# Patient Record
Sex: Male | Born: 1955 | Race: White | Hispanic: No | Smoking: Current every day smoker
Health system: Southern US, Community
[De-identification: ages and names within clinical notes are randomized; demographics above are authoritative.]

## PROBLEM LIST (undated history)

## (undated) DIAGNOSIS — D66 Hereditary factor VIII deficiency: Secondary | ICD-10-CM

## (undated) DIAGNOSIS — B1920 Unspecified viral hepatitis C without hepatic coma: Secondary | ICD-10-CM

## (undated) DIAGNOSIS — J449 Chronic obstructive pulmonary disease, unspecified: Secondary | ICD-10-CM

## (undated) DIAGNOSIS — C801 Malignant (primary) neoplasm, unspecified: Secondary | ICD-10-CM

## (undated) DIAGNOSIS — I10 Essential (primary) hypertension: Secondary | ICD-10-CM

## (undated) DIAGNOSIS — I456 Pre-excitation syndrome: Secondary | ICD-10-CM

## (undated) DIAGNOSIS — E7800 Pure hypercholesterolemia, unspecified: Secondary | ICD-10-CM

## (undated) HISTORY — PX: TONSILLECTOMY: SUR1361

## (undated) HISTORY — PX: SUBDURAL HEMATOMA EVACUATION VIA CRANIOTOMY: SUR319

---

## 2004-11-17 ENCOUNTER — Emergency Department: Payer: Self-pay | Admitting: Emergency Medicine

## 2004-12-01 ENCOUNTER — Emergency Department: Payer: Self-pay | Admitting: Emergency Medicine

## 2007-03-18 ENCOUNTER — Inpatient Hospital Stay: Payer: Self-pay | Admitting: Internal Medicine

## 2007-03-18 ENCOUNTER — Other Ambulatory Visit: Payer: Self-pay

## 2009-05-08 ENCOUNTER — Observation Stay: Payer: Self-pay | Admitting: Internal Medicine

## 2012-01-30 ENCOUNTER — Emergency Department: Payer: Self-pay | Admitting: Emergency Medicine

## 2012-06-19 ENCOUNTER — Emergency Department: Payer: Self-pay | Admitting: Emergency Medicine

## 2014-03-28 ENCOUNTER — Emergency Department: Payer: Self-pay | Admitting: Physician Assistant

## 2014-04-08 ENCOUNTER — Emergency Department
Admit: 2014-04-08 | Disposition: A | Discharge status: Home / Self Care | Payer: Self-pay | Admitting: Emergency Medicine

## 2014-06-13 ENCOUNTER — Emergency Department: Payer: Medicare Other

## 2014-06-13 ENCOUNTER — Emergency Department
Admission: EM | Admit: 2014-06-13 | Discharge: 2014-06-14 | Disposition: A | Discharge status: Home / Self Care | Payer: Medicare Other | Attending: Emergency Medicine | Admitting: Emergency Medicine

## 2014-06-13 ENCOUNTER — Other Ambulatory Visit: Payer: Self-pay

## 2014-06-13 ENCOUNTER — Encounter: Payer: Self-pay | Admitting: *Deleted

## 2014-06-13 DIAGNOSIS — Z720 Tobacco use: Secondary | ICD-10-CM | POA: Diagnosis not present

## 2014-06-13 DIAGNOSIS — J441 Chronic obstructive pulmonary disease with (acute) exacerbation: Secondary | ICD-10-CM | POA: Insufficient documentation

## 2014-06-13 DIAGNOSIS — Z880 Allergy status to penicillin: Secondary | ICD-10-CM | POA: Diagnosis not present

## 2014-06-13 DIAGNOSIS — J189 Pneumonia, unspecified organism: Secondary | ICD-10-CM | POA: Diagnosis not present

## 2014-06-13 DIAGNOSIS — R0602 Shortness of breath: Secondary | ICD-10-CM | POA: Diagnosis present

## 2014-06-13 HISTORY — DX: Unspecified viral hepatitis C without hepatic coma: B19.20

## 2014-06-13 HISTORY — DX: Pre-excitation syndrome: I45.6

## 2014-06-13 HISTORY — DX: Chronic obstructive pulmonary disease, unspecified: J44.9

## 2014-06-13 LAB — CBC
HEMATOCRIT: 43.2 % (ref 40.0–52.0)
HEMOGLOBIN: 15 g/dL (ref 13.0–18.0)
MCH: 30.4 pg (ref 26.0–34.0)
MCHC: 34.6 g/dL (ref 32.0–36.0)
MCV: 87.8 fL (ref 80.0–100.0)
Platelets: 227 10*3/uL (ref 150–440)
RBC: 4.92 MIL/uL (ref 4.40–5.90)
RDW: 13 % (ref 11.5–14.5)
WBC: 16.2 10*3/uL — AB (ref 3.8–10.6)

## 2014-06-13 LAB — BASIC METABOLIC PANEL
Anion gap: 14 (ref 5–15)
BUN: 13 mg/dL (ref 6–20)
CALCIUM: 9 mg/dL (ref 8.9–10.3)
CO2: 32 mmol/L (ref 22–32)
Chloride: 77 mmol/L — ABNORMAL LOW (ref 101–111)
Creatinine, Ser: 1.01 mg/dL (ref 0.61–1.24)
GFR calc non Af Amer: >60 mL/min (ref >60)
Glucose, Bld: 151 mg/dL — ABNORMAL HIGH (ref 65–99)
Potassium: 3.2 mmol/L — ABNORMAL LOW (ref 3.5–5.1)
Sodium: 123 mmol/L — ABNORMAL LOW (ref 135–145)

## 2014-06-13 LAB — TROPONIN I: Troponin I: <0.03 ng/mL (ref <0.031)

## 2014-06-13 MED ORDER — PREDNISONE 20 MG PO TABS
60.0000 mg | ORAL_TABLET | Freq: Once | ORAL | Status: AC
Start: 2014-06-13 — End: 2014-06-13
  Administered 2014-06-13: 60 mg via ORAL

## 2014-06-13 MED ORDER — LEVOFLOXACIN 500 MG PO TABS
ORAL_TABLET | ORAL | Status: AC
  Filled 2014-06-13: qty 1

## 2014-06-13 MED ORDER — PREDNISONE 20 MG PO TABS: 40.0000 mg | ORAL_TABLET | Freq: Every day | ORAL | Status: DC

## 2014-06-13 MED ORDER — LEVOFLOXACIN 250 MG PO TABS
ORAL_TABLET | ORAL | Status: AC
  Administered 2014-06-13: 750 mg via ORAL
  Filled 2014-06-13: qty 1

## 2014-06-13 MED ORDER — LEVOFLOXACIN 750 MG PO TABS
750.0000 mg | ORAL_TABLET | Freq: Once | ORAL | Status: AC
  Administered 2014-06-13: 750 mg via ORAL

## 2014-06-13 MED ORDER — PREDNISONE 20 MG PO TABS
ORAL_TABLET | ORAL | Status: AC
  Administered 2014-06-13: 60 mg via ORAL
  Filled 2014-06-13: qty 3

## 2014-06-13 MED ORDER — SODIUM CHLORIDE 0.9 % IV BOLUS (SEPSIS): 500.0000 mL | Freq: Once | INTRAVENOUS | Status: DC

## 2014-06-13 MED ORDER — LEVOFLOXACIN 750 MG PO TABS: 750.0000 mg | ORAL_TABLET | Freq: Every day | ORAL | Status: DC

## 2014-06-13 MED ORDER — IPRATROPIUM-ALBUTEROL 0.5-2.5 (3) MG/3ML IN SOLN
3.0000 mL | Freq: Once | RESPIRATORY_TRACT | Status: AC
  Administered 2014-06-13: 3 mL via RESPIRATORY_TRACT

## 2014-06-13 MED ORDER — IPRATROPIUM-ALBUTEROL 0.5-2.5 (3) MG/3ML IN SOLN
RESPIRATORY_TRACT | Status: AC
  Administered 2014-06-13: 3 mL via RESPIRATORY_TRACT
  Filled 2014-06-13: qty 3

## 2014-06-13 NOTE — ED Notes (Signed)
Pt presents w/ c/o intermittent chills and fevers, increasing shortness of breath, today starting w/ gagging, denies vomiting. Pt has audible wheezing, labored breathing, and accessory muscle use. Pt endorses hx of COPD. States he has been using his meds and MDI's as appropriate w/o relief.

## 2014-06-13 NOTE — ED Provider Notes (Signed)
[facility] Emergency Department Provider Note  ____________________________________________  Time seen: 11:10 PM  I have reviewed the triage vital signs and the nursing notes.   HISTORY  Chief Complaint Shortness of Breath    HPI Paul Haynes is a 59 y.o. male who has chronic COPD and is reported long-standing shortness of breath who was today with complaints of a gagging or choking sensation. He feels that his wheezing is actually better than usual and that his breathing is otherwise in good shape right now. He does however note that he has been having intermittent low-grade fevers to 100.8 maximum over the last 2 days with chills intermittently. Denies any vomiting diarrhea chest pain or dizziness. No loss of balance or coordination or falls or passing out.He has had increased coughing but is nonproductive     Past Medical History  Diagnosis Date  . COPD (chronic obstructive pulmonary disease)   . WPW (Wolff-Parkinson-White syndrome)   . Hepatitis C     There are no active problems to display for this patient.   Past Surgical History  Procedure Laterality Date  . Tonsillectomy    . Subdural hematoma evacuation via craniotomy      Current Outpatient Rx  Name  Route  Sig  Dispense  Refill  . levofloxacin (LEVAQUIN) 750 MG tablet   Oral   Take 1 tablet (750 mg total) by mouth daily.   7 tablet   0   . predniSONE (DELTASONE) 20 MG tablet   Oral   Take 2 tablets (40 mg total) by mouth daily.   8 tablet   0     Allergies Penicillins  History reviewed. No pertinent family history.  Social History History  Substance Use Topics  . Smoking status: Current Every Day Smoker  . Smokeless tobacco: Never Used  . Alcohol Use: No    Review of Systems  Constitutional: Low-grade fever and chills. No weight changes Eyes:No blurry vision or double vision.  ENT: No sore throat. Cardiovascular: No chest pain. Respiratory: Chronic  shortness of breath, increased nonproductive cough Gastrointestinal: Negative for abdominal pain, vomiting and diarrhea.  No BRBPR or melena. Genitourinary: Negative for dysuria, urinary retention, bloody urine, or difficulty urinating. Musculoskeletal: Negative for back pain. No joint swelling or pain. Skin: Negative for rash. Neurological: Negative for headaches, focal weakness or numbness. Psychiatric:No anxiety or depression.   Endocrine:No hot/cold intolerance, changes in energy, or sleep difficulty.  10-point ROS otherwise negative.  ____________________________________________   PHYSICAL EXAM:  VITAL SIGNS: ED Triage Vitals  Enc Vitals Group     BP 06/13/14 2009 147/71 mmHg     Pulse Rate 06/13/14 2009 66     Resp 06/13/14 2009 24     Temp 06/13/14 2009 98.4 F (36.9 C)     Temp Source 06/13/14 2009 Oral     SpO2 06/13/14 2009 95 %     Weight 06/13/14 2009 190 lb (86.183 kg)     Height 06/13/14 2009 5\' 7"  (1.702 m)     Head Cir --      Peak Flow --      Pain Score --      Pain Loc --      Pain Edu? --      Excl. in [place]? --      Constitutional: Alert and oriented. Well appearing and in no distress. Eyes: No scleral icterus. No conjunctival pallor. PERRL. EOMI ENT   Head: Normocephalic and atraumatic.   Nose: No congestion/rhinnorhea.  No septal hematoma   Mouth/Throat: MMM, no pharyngeal erythema. No peritonsillar mass. No uvula shift.   Neck: No stridor. No SubQ emphysema. No meningismus. Hematological/Lymphatic/Immunilogical: No cervical lymphadenopathy. Cardiovascular: RRR. Normal and symmetric distal pulses are present in all extremities. No murmurs, rubs, or gallops. Respiratory: Normal respiratory effort without tachypnea nor retractions. Diffuse wheezing in all lung fields with normal expiratory phase. Gastrointestinal: Soft and nontender. No distention. There is no CVA tenderness.  No rebound, rigidity, or guarding. Genitourinary:  deferred Musculoskeletal: Nontender with normal range of motion in all extremities. No joint effusions.  No lower extremity tenderness.  No edema. Neurologic:   Normal speech and language.  CN 2-10 normal. Motor grossly intact. No pronator drift.  Normal gait. No gross focal neurologic deficits are appreciated.  Skin:  Skin is warm, dry and intact. No rash noted.  No petechiae, purpura, or bullae. Psychiatric: Mood and affect are normal. Speech and behavior are normal. Patient exhibits appropriate insight and judgment.  ____________________________________________    LABS (pertinent positives/negatives) (all labs ordered are listed, but only abnormal results are displayed) Labs Reviewed  BASIC METABOLIC PANEL - Abnormal; Notable for the following:    Sodium 123 (*)    Potassium 3.2 (*)    Chloride 77 (*)    Glucose, Bld 151 (*)    All other components within normal limits  CBC - Abnormal; Notable for the following:    WBC 16.2 (*)    All other components within normal limits  TROPONIN I   ____________________________________________   EKG  Interpreted by me  Date: 06/13/2014  Rate: 69  Rhythm: normal sinus rhythm  QRS Axis: normal  Intervals: normal  ST/T Wave abnormalities: normal  Conduction Disutrbances: none  Narrative Interpretation: unremarkable      ____________________________________________    RADIOLOGY  Chest x-ray unremarkable  ____________________________________________   PROCEDURES  ____________________________________________   INITIAL IMPRESSION / ASSESSMENT AND PLAN / ED COURSE  Pertinent labs & imaging results that were available during my care of the patient were reviewed by me and considered in my medical decision making (see chart for details).  Patient is well-appearing no acute distress. Watching TV breathing comfortably. He is eating and drinking without difficulty. His symptoms and the hyponatremia on labs as well as the  diffuse wheezing and absence of focal findings on auscultation or chest x-ray are consistent with an atypical pneumonia, possibly Legionella. The patient denies any recent trauma or institutional stays. We'll start him on doxycycline and a short course of steroids to treat any acquired atypical pneumonia have him follow-up with his primary care doctor in a week. Vision of ACS CHF PE or pneumothorax 3 times a day or sepsis.  ____________________________________________   FINAL CLINICAL IMPRESSION(S) / ED DIAGNOSES  Final diagnoses:  Atypical pneumonia      Carrie Mew, MD 06/13/14 2351

## 2014-06-13 NOTE — Discharge Instructions (Signed)

## 2014-06-14 NOTE — ED Notes (Signed)
Pt alert and in NAD at time of d\c to family.

## 2014-06-23 ENCOUNTER — Emergency Department: Payer: Medicare Other

## 2014-06-23 ENCOUNTER — Encounter: Payer: Self-pay | Admitting: Emergency Medicine

## 2014-06-23 ENCOUNTER — Other Ambulatory Visit: Payer: Self-pay

## 2014-06-23 ENCOUNTER — Emergency Department
Admission: EM | Admit: 2014-06-23 | Discharge: 2014-06-23 | Disposition: A | Discharge status: Home / Self Care | Payer: Medicare Other | Attending: Emergency Medicine | Admitting: Emergency Medicine

## 2014-06-23 DIAGNOSIS — Z720 Tobacco use: Secondary | ICD-10-CM | POA: Diagnosis not present

## 2014-06-23 DIAGNOSIS — S29011A Strain of muscle and tendon of front wall of thorax, initial encounter: Secondary | ICD-10-CM

## 2014-06-23 DIAGNOSIS — Y9289 Other specified places as the place of occurrence of the external cause: Secondary | ICD-10-CM | POA: Insufficient documentation

## 2014-06-23 DIAGNOSIS — Y9389 Activity, other specified: Secondary | ICD-10-CM | POA: Insufficient documentation

## 2014-06-23 DIAGNOSIS — Z792 Long term (current) use of antibiotics: Secondary | ICD-10-CM | POA: Insufficient documentation

## 2014-06-23 DIAGNOSIS — Y998 Other external cause status: Secondary | ICD-10-CM | POA: Diagnosis not present

## 2014-06-23 DIAGNOSIS — R079 Chest pain, unspecified: Secondary | ICD-10-CM

## 2014-06-23 DIAGNOSIS — I10 Essential (primary) hypertension: Secondary | ICD-10-CM | POA: Diagnosis not present

## 2014-06-23 DIAGNOSIS — Z880 Allergy status to penicillin: Secondary | ICD-10-CM | POA: Diagnosis not present

## 2014-06-23 DIAGNOSIS — X58XXXA Exposure to other specified factors, initial encounter: Secondary | ICD-10-CM | POA: Insufficient documentation

## 2014-06-23 DIAGNOSIS — R0789 Other chest pain: Secondary | ICD-10-CM | POA: Diagnosis present

## 2014-06-23 HISTORY — DX: Pure hypercholesterolemia, unspecified: E78.00

## 2014-06-23 HISTORY — DX: Essential (primary) hypertension: I10

## 2014-06-23 LAB — CBC
HCT: 40.2 % (ref 40.0–52.0)
HEMOGLOBIN: 13.7 g/dL (ref 13.0–18.0)
MCH: 30.4 pg (ref 26.0–34.0)
MCHC: 34 g/dL (ref 32.0–36.0)
MCV: 89.4 fL (ref 80.0–100.0)
Platelets: 231 10*3/uL (ref 150–440)
RBC: 4.49 MIL/uL (ref 4.40–5.90)
RDW: 13 % (ref 11.5–14.5)
WBC: 10.7 10*3/uL — ABNORMAL HIGH (ref 3.8–10.6)

## 2014-06-23 LAB — TROPONIN I: Troponin I: <0.03 ng/mL (ref <0.031)

## 2014-06-23 LAB — BASIC METABOLIC PANEL
Anion gap: 9 (ref 5–15)
BUN: 18 mg/dL (ref 6–20)
CHLORIDE: 95 mmol/L — AB (ref 101–111)
CO2: 29 mmol/L (ref 22–32)
Calcium: 8.3 mg/dL — ABNORMAL LOW (ref 8.9–10.3)
Creatinine, Ser: 1.07 mg/dL (ref 0.61–1.24)
GFR calc Af Amer: >60 mL/min (ref >60)
GFR calc non Af Amer: >60 mL/min (ref >60)
GLUCOSE: 217 mg/dL — AB (ref 65–99)
POTASSIUM: 3.4 mmol/L — AB (ref 3.5–5.1)
SODIUM: 133 mmol/L — AB (ref 135–145)

## 2014-06-23 MED ORDER — GUAIFENESIN-CODEINE 100-10 MG/5ML PO SOLN: 10.0000 mL | Freq: Four times a day (QID) | ORAL | Status: DC | PRN

## 2014-06-23 MED ORDER — BENZONATATE 100 MG PO CAPS
ORAL_CAPSULE | ORAL | Status: AC
  Administered 2014-06-23: 100 mg via ORAL
  Filled 2014-06-23: qty 1

## 2014-06-23 MED ORDER — BENZONATATE 100 MG PO CAPS
100.0000 mg | ORAL_CAPSULE | Freq: Once | ORAL | Status: AC
  Administered 2014-06-23: 100 mg via ORAL

## 2014-06-23 NOTE — ED Notes (Signed)
Pt reports that he coughed three days ago and states that his right side of his chest is hurting him.

## 2014-06-23 NOTE — Discharge Instructions (Signed)
Chest Pain (Nonspecific) °It is often hard to give a specific diagnosis for the cause of chest pain. There is always a chance that your pain could be related to something serious, such as a heart attack or a blood clot in the lungs. You need to follow up with your health care provider for further evaluation. °CAUSES  °· Heartburn. °· Pneumonia or bronchitis. °· Anxiety or stress. °· Inflammation around your heart (pericarditis) or lung (pleuritis or pleurisy). °· A blood clot in the lung. °· A collapsed lung (pneumothorax). It can develop suddenly on its own (spontaneous pneumothorax) or from trauma to the chest. °· Shingles infection (herpes zoster virus). °The chest wall is composed of bones, muscles, and cartilage. Any of these can be the source of the pain. °· The bones can be bruised by injury. °· The muscles or cartilage can be strained by coughing or overwork. °· The cartilage can be affected by inflammation and become sore (costochondritis). °DIAGNOSIS  °Lab tests or other studies may be needed to find the cause of your pain. Your health care provider may have you take a test called an ambulatory electrocardiogram (ECG). An ECG records your heartbeat patterns over a 24-hour period. You may also have other tests, such as: °· Transthoracic echocardiogram (TTE). During echocardiography, sound waves are used to evaluate how blood flows through your heart. °· Transesophageal echocardiogram (TEE). °· Cardiac monitoring. This allows your health care provider to monitor your heart rate and rhythm in real time. °· Holter monitor. This is a portable device that records your heartbeat and can help diagnose heart arrhythmias. It allows your health care provider to track your heart activity for several days, if needed. °· Stress tests by exercise or by giving medicine that makes the heart beat faster. °TREATMENT  °· Treatment depends on what may be causing your chest pain. Treatment may include: °· Acid blockers for  heartburn. °· Anti-inflammatory medicine. °· Pain medicine for inflammatory conditions. °· Antibiotics if an infection is present. °· You may be advised to change lifestyle habits. This includes stopping smoking and avoiding alcohol, caffeine, and chocolate. °· You may be advised to keep your head raised (elevated) when sleeping. This reduces the chance of acid going backward from your stomach into your esophagus. °Most of the time, nonspecific chest pain will improve within 2-3 days with rest and mild pain medicine.  °HOME CARE INSTRUCTIONS  °· If antibiotics were prescribed, take them as directed. Finish them even if you start to feel better. °· For the next few days, avoid physical activities that bring on chest pain. Continue physical activities as directed. °· Do not use any tobacco products, including cigarettes, chewing tobacco, or electronic cigarettes. °· Avoid drinking alcohol. °· Only take medicine as directed by your health care provider. °· Follow your health care provider's suggestions for further testing if your chest pain does not go away. °· Keep any follow-up appointments you made. If you do not go to an appointment, you could develop lasting (chronic) problems with pain. If there is any problem keeping an appointment, call to reschedule. °SEEK MEDICAL CARE IF:  °· Your chest pain does not go away, even after treatment. °· You have a rash with blisters on your chest. °· You have a fever. °SEEK IMMEDIATE MEDICAL CARE IF:  °· You have increased chest pain or pain that spreads to your arm, neck, jaw, back, or abdomen. °· You have shortness of breath. °· You have an increasing cough, or you cough   up blood. °· You have severe back or abdominal pain. °· You feel nauseous or vomit. °· You have severe weakness. °· You faint. °· You have chills. °This is an emergency. Do not wait to see if the pain will go away. Get medical help at once. Call your local emergency services (911 in [place] Do not drive  yourself to the hospital. °MAKE SURE YOU:  °· Understand these instructions. °· Will watch your condition. °· Will get help right away if you are not doing well or get worse. °Document Released: 10/05/2004 Document Revised: 12/31/2012 Document Reviewed: 08/01/2007 °ExitCare® Patient Information ©2015 [employer]. This information is not intended to replace advice given to you by your health care provider. Make sure you discuss any questions you have with your health care provider. ° °Chest Wall Pain °Chest wall pain is pain in or around the bones and muscles of your chest. It may take up to 6 weeks to get better. It may take longer if you must stay physically active in your work and activities.  °CAUSES  °Chest wall pain may happen on its own. However, it may be caused by: °· A viral illness like the flu. °· Injury. °· Coughing. °· Exercise. °· Arthritis. °· Fibromyalgia. °· Shingles. °HOME CARE INSTRUCTIONS  °· Avoid overtiring physical activity. Try not to strain or perform activities that cause pain. This includes any activities using your chest or your abdominal and side muscles, especially if heavy weights are used. °· Put ice on the sore area. °¨ Put ice in a plastic bag. °¨ Place a towel between your skin and the bag. °¨ Leave the ice on for 15-20 minutes per hour while awake for the first 2 days. °· Only take over-the-counter or prescription medicines for pain, discomfort, or fever as directed by your caregiver. °SEEK IMMEDIATE MEDICAL CARE IF:  °· Your pain increases, or you are very uncomfortable. °· You have a fever. °· Your chest pain becomes worse. °· You have new, unexplained symptoms. °· You have nausea or vomiting. °· You feel sweaty or lightheaded. °· You have a cough with phlegm (sputum), or you cough up blood. °MAKE SURE YOU:  °· Understand these instructions. °· Will watch your condition. °· Will get help right away if you are not doing well or get worse. °Document Released: 12/26/2004 Document  Revised: 03/20/2011 Document Reviewed: 08/22/2010 °ExitCare® Patient Information ©2015 [employer]. This information is not intended to replace advice given to you by your health care provider. Make sure you discuss any questions you have with your health care provider. ° °

## 2014-06-23 NOTE — ED Provider Notes (Signed)
[facility] Emergency Department Provider Note  Time seen: 8:37 PM  I have reviewed the triage vital signs and the nursing notes.   HISTORY  Chief Complaint Chest Pain    HPI Paul Haynes is a 59 y.o. male presents to the emergency department for right-sided chest pain 2 days. According to the patient he was seen here several days ago for a cough and congestion, diagnosed with bronchitis given antibiotics and steroid-induced. She states the cough has been improving, but 3 days ago while coughing he felt a tearing sensation in the right side of his chest. He states he has had a fairly constant dull pain to that area which is exacerbated sharply when he coughs. He denies any current sputum production. Denies fever. States his pain is mild at rest, moderate when coughing. Patient was concerned that he possibly developed a pneumonia so he came to the emergency department for evaluation.     Past Medical History  Diagnosis Date  . COPD (chronic obstructive pulmonary disease)   . WPW (Wolff-Parkinson-White syndrome)   . Hepatitis C   . Hypertension   . High cholesterol     There are no active problems to display for this patient.   Past Surgical History  Procedure Laterality Date  . Tonsillectomy    . Subdural hematoma evacuation via craniotomy      Current Outpatient Rx  Name  Route  Sig  Dispense  Refill  . levofloxacin (LEVAQUIN) 750 MG tablet   Oral   Take 1 tablet (750 mg total) by mouth daily.   7 tablet   0   . predniSONE (DELTASONE) 20 MG tablet   Oral   Take 2 tablets (40 mg total) by mouth daily.   8 tablet   0     Allergies Penicillins  History reviewed. No pertinent family history.  Social History History  Substance Use Topics  . Smoking status: Current Every Day Smoker  . Smokeless tobacco: Never Used  . Alcohol Use: No    Review of Systems Constitutional: Negative for fever. Cardiovascular: Positive for  right-sided chest pain. Respiratory: Negative for shortness of breath. Positive for cough. Dry Gastrointestinal: Negative for abdominal pain, vomiting and diarrhea. Genitourinary: Negative for dysuria. Musculoskeletal: Negative for back pain.  10-point ROS otherwise negative.  ____________________________________________   PHYSICAL EXAM:  VITAL SIGNS: ED Triage Vitals  Enc Vitals Group     BP 06/23/14 1830 141/71 mmHg     Pulse Rate 06/23/14 1830 67     Resp 06/23/14 1830 22     Temp 06/23/14 1830 98.2 F (36.8 C)     Temp Source 06/23/14 1830 Oral     SpO2 06/23/14 1830 96 %     Weight 06/23/14 1830 187 lb (84.823 kg)     Height 06/23/14 1830 5\' 7"  (1.702 m)     Head Cir --      Peak Flow --      Pain Score 06/23/14 1818 6     Pain Loc --      Pain Edu? --      Excl. in [place]? --     Constitutional: Alert and oriented. Well appearing and in no distress. ENT   Head: Normocephalic and atraumatic.   Mouth/Throat: Mucous membranes are moist. Cardiovascular: Normal rate, regular rhythm. No murmur Respiratory: Normal respiratory effort without tachypnea nor retractions. Breath sounds are clear and equal bilaterally. No wheezes/rales/rhonchi. Gastrointestinal: Soft and nontender. No distention.   Musculoskeletal: Nontender  with normal range of motion in all extremities.  Neurologic:  Normal speech and language. No gross focal neurologic deficits are appreciated. Speech is normal. Skin:  Skin is warm, dry and intact.  Psychiatric: Mood and affect are normal. Speech and behavior are normal. ____________________________________________    EKG  EKG reviewed and interpreted by myself shows normal sinus rhythm at 68 bpm, narrow QRS, normal axis, normal intervals, no ST changes noted. Overall normal EKG.  ____________________________________________    RADIOLOGY  Chest x-ray within normal limits  ____________________________________________    INITIAL IMPRESSION /  ASSESSMENT AND PLAN / ED COURSE  Pertinent labs & imaging results that were available during my care of the patient were reviewed by me and considered in my medical decision making (see chart for details).  Labs are largely within normal limits, chest x-ray and EKG appear well. Patient likely with intercostal strain. We will treat with a codeine based cough medication and have the patient follow-up with his primary care doctor. The patient is agreeable to plan.  ____________________________________________   FINAL CLINICAL IMPRESSION(S) / ED DIAGNOSES  Intercostal strain  chest pain   Harvest Dark, MD 06/23/14 2040

## 2014-06-23 NOTE — ED Notes (Signed)
Patient was here on 6/4 for cough. Was given levaquin and prednisone. Had some improvement. About 3 days ago coughed hard and felt "a tear" across right front of chest. Still painful to cough.

## 2015-01-13 ENCOUNTER — Encounter: Payer: Self-pay | Admitting: Emergency Medicine

## 2015-01-13 ENCOUNTER — Emergency Department
Admission: EM | Admit: 2015-01-13 | Discharge: 2015-01-13 | Disposition: A | Discharge status: Home / Self Care | Payer: Medicare Other | Attending: Emergency Medicine | Admitting: Emergency Medicine

## 2015-01-13 DIAGNOSIS — Z880 Allergy status to penicillin: Secondary | ICD-10-CM | POA: Insufficient documentation

## 2015-01-13 DIAGNOSIS — I10 Essential (primary) hypertension: Secondary | ICD-10-CM | POA: Insufficient documentation

## 2015-01-13 DIAGNOSIS — F17200 Nicotine dependence, unspecified, uncomplicated: Secondary | ICD-10-CM | POA: Insufficient documentation

## 2015-01-13 DIAGNOSIS — R112 Nausea with vomiting, unspecified: Secondary | ICD-10-CM | POA: Diagnosis not present

## 2015-01-13 DIAGNOSIS — R1010 Upper abdominal pain, unspecified: Secondary | ICD-10-CM | POA: Diagnosis not present

## 2015-01-13 DIAGNOSIS — R109 Unspecified abdominal pain: Secondary | ICD-10-CM | POA: Diagnosis not present

## 2015-01-13 DIAGNOSIS — Z7952 Long term (current) use of systemic steroids: Secondary | ICD-10-CM | POA: Diagnosis not present

## 2015-01-13 DIAGNOSIS — Z792 Long term (current) use of antibiotics: Secondary | ICD-10-CM | POA: Insufficient documentation

## 2015-01-13 LAB — CBC
HCT: 47.5 % (ref 40.0–52.0)
Hemoglobin: 16.2 g/dL (ref 13.0–18.0)
MCH: 29.7 pg (ref 26.0–34.0)
MCHC: 34.1 g/dL (ref 32.0–36.0)
MCV: 87.1 fL (ref 80.0–100.0)
PLATELETS: 244 10*3/uL (ref 150–440)
RBC: 5.46 MIL/uL (ref 4.40–5.90)
RDW: 13 % (ref 11.5–14.5)
WBC: 11.5 10*3/uL — AB (ref 3.8–10.6)

## 2015-01-13 LAB — URINALYSIS COMPLETE WITH MICROSCOPIC (ARMC ONLY)
Bilirubin Urine: NEGATIVE
GLUCOSE, UA: NEGATIVE mg/dL
Hgb urine dipstick: NEGATIVE
KETONES UR: NEGATIVE mg/dL
LEUKOCYTES UA: NEGATIVE
NITRITE: NEGATIVE
Protein, ur: 30 mg/dL — AB
SPECIFIC GRAVITY, URINE: 1.02 (ref 1.005–1.030)
SQUAMOUS EPITHELIAL / LPF: NONE SEEN
pH: 6 (ref 5.0–8.0)

## 2015-01-13 LAB — COMPREHENSIVE METABOLIC PANEL
ALT: 27 U/L (ref 17–63)
AST: 32 U/L (ref 15–41)
Albumin: 4.3 g/dL (ref 3.5–5.0)
Alkaline Phosphatase: 96 U/L (ref 38–126)
Anion gap: 8 (ref 5–15)
BILIRUBIN TOTAL: 0.9 mg/dL (ref 0.3–1.2)
BUN: 16 mg/dL (ref 6–20)
CALCIUM: 9.5 mg/dL (ref 8.9–10.3)
CO2: 32 mmol/L (ref 22–32)
CREATININE: 1.27 mg/dL — AB (ref 0.61–1.24)
Chloride: 94 mmol/L — ABNORMAL LOW (ref 101–111)
GFR calc Af Amer: >60 mL/min (ref >60)
GFR, EST NON AFRICAN AMERICAN: 59 mL/min — AB (ref >60)
Glucose, Bld: 188 mg/dL — ABNORMAL HIGH (ref 65–99)
Potassium: 3.8 mmol/L (ref 3.5–5.1)
Sodium: 134 mmol/L — ABNORMAL LOW (ref 135–145)
TOTAL PROTEIN: 9 g/dL — AB (ref 6.5–8.1)

## 2015-01-13 LAB — TROPONIN I: TROPONIN I: 0.03 ng/mL (ref <0.031)

## 2015-01-13 LAB — LIPASE, BLOOD: Lipase: 25 U/L (ref 11–51)

## 2015-01-13 MED ORDER — SODIUM CHLORIDE 0.9 % IV BOLUS (SEPSIS)
1000.0000 mL | Freq: Once | INTRAVENOUS | Status: AC
  Administered 2015-01-13: 1000 mL via INTRAVENOUS

## 2015-01-13 MED ORDER — ONDANSETRON 4 MG PO TBDP: 4.0000 mg | ORAL_TABLET | Freq: Three times a day (TID) | ORAL | Status: DC | PRN

## 2015-01-13 MED ORDER — PROMETHAZINE HCL 25 MG/ML IJ SOLN
25.0000 mg | Freq: Once | INTRAMUSCULAR | Status: AC
  Administered 2015-01-13: 25 mg via INTRAVENOUS
  Filled 2015-01-13: qty 1

## 2015-01-13 NOTE — ED Notes (Signed)
Pt presents with n/v started last night with some abd pain. No vomiting noted in triage. vss.

## 2015-01-13 NOTE — Discharge Instructions (Signed)

## 2015-01-13 NOTE — ED Provider Notes (Signed)
[facility] Emergency Department Provider Note  Time seen: 8:35 AM  I have reviewed the triage vital signs and the nursing notes.   HISTORY  Chief Complaint No chief complaint on file.    HPI Paul Haynes is a 60 y.o. male with a past medical history of COPD, Wolff-Parkinson-White, hepatitis C now in remission, hypertension, hyperlipidemia, who presents the emergency department with nausea, vomiting, upper abdominal pain. According to the patient around 8 PM last night he developed upper abdominal discomfort. Around 10 PM he began vomiting. He states he felt nauseated with several episodes of vomiting overnight. However since arriving to the emergency department the patient states he is feeling considerably better, denies any abdominal pain at this point. States mild nausea but has not vomited since coming to the emergency department. Patient states he used to get this same pain with nausea frequently, he was told it is likely due to his liver. Patient went through hepatitis C treatment and is now in remission as of 3 years ago. Patient states he has not really had this pain since taking the hepatitis C treatment. Denies any further drug or alcohol use since his hepatitis treatment. Denies any chest pain or shortness of breath now or at any time. Patient states that his abdominal pain was an 8/10, now a 0/10. Dull aching sensation mostly in the upper abdomen.     Past Medical History  Diagnosis Date  . COPD (chronic obstructive pulmonary disease) ([place])   . WPW (Wolff-Parkinson-White syndrome)   . Hepatitis C   . Hypertension   . High cholesterol     There are no active problems to display for this patient.   Past Surgical History  Procedure Laterality Date  . Tonsillectomy    . Subdural hematoma evacuation via craniotomy      Current Outpatient Rx  Name  Route  Sig  Dispense  Refill  . guaiFENesin-codeine 100-10 MG/5ML syrup   Oral   Take 10 mLs by  mouth every 6 (six) hours as needed for cough.   120 mL   0   . levofloxacin (LEVAQUIN) 750 MG tablet   Oral   Take 1 tablet (750 mg total) by mouth daily.   7 tablet   0   . predniSONE (DELTASONE) 20 MG tablet   Oral   Take 2 tablets (40 mg total) by mouth daily.   8 tablet   0     Allergies Penicillins  No family history on file.  Social History Social History  Substance Use Topics  . Smoking status: Current Every Day Smoker  . Smokeless tobacco: Never Used  . Alcohol Use: No    Review of Systems Constitutional: Negative for fever. Cardiovascular: Negative for chest pain. Respiratory: Negative for shortness of breath. Gastrointestinal: Positive for upper abdominal pain, nausea and vomiting. Negative for diarrhea or constipation.  Genitourinary: Negative for dysuria. Musculoskeletal: Negative for back pain. Neurological: Negative for headache 10-point ROS otherwise negative.  ____________________________________________   PHYSICAL EXAM:  VITAL SIGNS: ED Triage Vitals  Enc Vitals Group     BP 01/13/15 0708 149/73 mmHg     Pulse Rate 01/13/15 0706 72     Resp 01/13/15 0706 20     Temp 01/13/15 0706 97.5 F (36.4 C)     Temp Source 01/13/15 0706 Oral     SpO2 01/13/15 0706 97 %     Weight 01/13/15 0706 185 lb (83.915 kg)     Height 01/13/15 0706  5\' 7"  (1.702 m)     Head Cir --      Peak Flow --      Pain Score 01/13/15 0707 5     Pain Loc --      Pain Edu? --      Excl. in [place]? --     Constitutional: Alert and oriented. Well appearing and in no distress. Eyes: Normal exam ENT   Head: Normocephalic and atraumatic.   Mouth/Throat: Mucous membranes are moist. Cardiovascular: Normal rate, regular rhythm. No murmur Respiratory: Normal respiratory effort without tachypnea nor retractions. Breath sounds are clear and equal bilaterally. No wheezes/rales/rhonchi. Gastrointestinal: Soft and nontender. No distention.   Musculoskeletal: Nontender with  normal range of motion in all extremities. Neurologic:  Normal speech and language. No gross focal neurologic deficits Skin:  Skin is warm, dry and intact.  Psychiatric: Mood and affect are normal. Speech and behavior are normal.   ____________________________________________    INITIAL IMPRESSION / ASSESSMENT AND PLAN / ED COURSE  Pertinent labs & imaging results that were available during my care of the patient were reviewed by me and considered in my medical decision making (see chart for details).  Patient presents the emergency department with upper abdominal discomfort, nausea and vomiting since last night which is largely resolved at this point. Patient states he is feeling much better, states mild nausea but denies any abdominal pain. Patient has a nontender exam. Labs are largely within normal limits including a normal lipase. We will add on a troponin as a precaution. Patient's LFTs are within normal limits. Slight leukocytosis at 11.5. Slight amount of RBCs in his urine although his pain/history does not appear consistent with ureterolithiasis.  Patient's labs are within normal limits. Troponin negative. Patient continues to deny any discomfort, states the nausea has completely resolved and he feels back to normal. We'll discharge the patient home with strict return precautions, the patient is agreeable.  ____________________________________________   FINAL CLINICAL IMPRESSION(S) / ED DIAGNOSES  Abdominal pain Nausea/vomiting   Harvest Dark, MD 01/13/15 1136

## 2015-01-13 NOTE — ED Notes (Signed)
Reports n/v and generalized abd pain onset last pm.  Skin w/d with good color.  abd soft, bowel sounds present.

## 2015-04-01 ENCOUNTER — Encounter: Payer: Self-pay | Admitting: Emergency Medicine

## 2015-04-01 ENCOUNTER — Emergency Department
Admission: EM | Admit: 2015-04-01 | Discharge: 2015-04-01 | Disposition: A | Discharge status: Home / Self Care | Payer: Medicare Other | Attending: Emergency Medicine | Admitting: Emergency Medicine

## 2015-04-01 DIAGNOSIS — J449 Chronic obstructive pulmonary disease, unspecified: Secondary | ICD-10-CM | POA: Diagnosis not present

## 2015-04-01 DIAGNOSIS — I456 Pre-excitation syndrome: Secondary | ICD-10-CM | POA: Insufficient documentation

## 2015-04-01 DIAGNOSIS — E7800 Pure hypercholesterolemia, unspecified: Secondary | ICD-10-CM | POA: Insufficient documentation

## 2015-04-01 DIAGNOSIS — M62121 Other rupture of muscle (nontraumatic), right upper arm: Secondary | ICD-10-CM | POA: Insufficient documentation

## 2015-04-01 DIAGNOSIS — Z7952 Long term (current) use of systemic steroids: Secondary | ICD-10-CM | POA: Diagnosis not present

## 2015-04-01 DIAGNOSIS — F17210 Nicotine dependence, cigarettes, uncomplicated: Secondary | ICD-10-CM | POA: Insufficient documentation

## 2015-04-01 DIAGNOSIS — Z792 Long term (current) use of antibiotics: Secondary | ICD-10-CM | POA: Insufficient documentation

## 2015-04-01 DIAGNOSIS — Z79899 Other long term (current) drug therapy: Secondary | ICD-10-CM | POA: Diagnosis not present

## 2015-04-01 DIAGNOSIS — S46211A Strain of muscle, fascia and tendon of other parts of biceps, right arm, initial encounter: Secondary | ICD-10-CM

## 2015-04-01 DIAGNOSIS — M79601 Pain in right arm: Secondary | ICD-10-CM | POA: Diagnosis present

## 2015-04-01 DIAGNOSIS — I10 Essential (primary) hypertension: Secondary | ICD-10-CM | POA: Diagnosis not present

## 2015-04-01 MED ORDER — IBUPROFEN 800 MG PO TABS: 800.0000 mg | ORAL_TABLET | Freq: Three times a day (TID) | ORAL | Status: AC | PRN

## 2015-04-01 MED ORDER — HYDROCODONE-ACETAMINOPHEN 5-325 MG PO TABS: 1.0000 | ORAL_TABLET | ORAL | Status: DC | PRN

## 2015-04-01 NOTE — ED Notes (Signed)
Pt presents to ED with right arm bruising and pain. Pt states twisted and felt a pop yesterday. Pt states noticed the bruising this morning. Pt demonstrates active ROM.
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
## 2015-04-01 NOTE — ED Notes (Signed)
See triage note.  Large bruised area noted to right upper arm  States he twisted and felt a pop

## 2015-04-01 NOTE — Discharge Instructions (Signed)
Biceps Tendon Disruption (Proximal) With Rehab The biceps tendon attaches the biceps muscle to the bones of the elbow and the shoulder. A proximal biceps tendon disruption is a tear of the long head of the tendon that attaches near the shoulder (more common) or a tear in the muscle near the muscle tendon junction (less common). These injuries usually involve a complete tear of the tendon from the bone. However, partial tears are also possible. The biceps muscle works with other muscles to bend the elbow and rotate the palm upward (supinate). A complete biceps rupture will result in about a 10% decrease in elbow bending strength and a 20% decrease in your ability to turn the palm upward, using the wrist. SYMPTOMS   Pain, tenderness, swelling, warmth, or redness over the front of the shoulder.  Pain that gets worse with shoulder and elbow use, especially against resistance (lifting or carrying).  Bruising (contusion) in the arm or elbow after 24 to 48 hours.  Bulge can be seen and felt in the arm.  Limited motion of the shoulder or elbow.  Weakness with attempted elbow bending or rotation of the wrist, such as when using a screwdriver.  Crackling sound (crepitation) when the tendon or shoulder is moved or touched. CAUSES  A biceps tendon rupture occurs when the tendon is subjected to a force that is greater than it can withstand. For example, a sudden force straightening the elbow while the biceps is flexed, or a direct hit (trauma) (rare). RISK INCREASES WITH:   Sports that involve contact, or throwing and overhead activities (racquet sports, gymnastics, weightlifting, bodybuilding).  Heavy labor.  Poor strength and flexibility.  Failure to warm up properly before activity. PREVENTION  Warm up and stretch properly before activity.  Maintain physical fitness:  Strength, flexibility, and endurance.  Cardiovascular fitness.  Allow your body to recover between practices and  competition.  Learn and use proper exercise technique. PROGNOSIS  If treated properly, the symptoms of a proximal biceps tendon disruption usually go away within 12 weeks of injury.  RELATED COMPLICATIONS  Longer healing time if not properly treated, or if not given enough time to heal.  Recurring symptoms, especially if activity is resumed too soon.  Weakness of elbow bending and forearm rotation.  Prolonged disability (uncommon). TREATMENT Treatment first involves the use of ice and medicine, to reduce pain and inflammation. It is also important to perform strengthening and stretching exercises and to modify activities that cause symptoms to get worse. These exercises may be performed at home or with a therapist. It is not possible to surgically fix the tendon (sew it together). However, surgery may be performed to reinsert the tendon into the arm bone. This will make the arm look normal again. Surgery is most often advised for younger, active individuals, especially those who require strength of wrist rotation.  MEDICATION  If pain medicine is needed, nonsteroidal anti-inflammatory medicines (aspirin and ibuprofen), or other minor pain relievers (acetaminophen), are often advised.  Do not take pain medicine for 7 days before surgery.  Prescription pain relievers may be given if your caregiver thinks they are needed. Use only as directed and only as much as you need.  Corticosteroid injections may be given to help reduce inflammation, but are not usually recommended for this injury. HEAT AND COLD  Cold treatment (icing) should be applied for 10 to 15 minutes every 2 to 3 hours for inflammation and pain, and immediately after activity that aggravates your symptoms. Use ice packs  or an ice massage.  Heat treatment may be used before performing stretching and strengthening activities prescribed by your caregiver, physical therapist, or athletic trainer. Use a heat pack or a warm water  soak. SEEK MEDICAL CARE IF:   Symptoms get worse or do not improve in 2 weeks, despite treatment.  New, unexplained symptoms develop. (Drugs used in treatment may produce side effects.) EXERCISES RANGE OF MOTION (ROM) AND STRETCHING EXERCISES - Biceps Tendon Disruption (Proximal) These exercises may help you when beginning to rehabilitate your injury. Your symptoms may go away with or without further involvement from your physician, physical therapist or athletic trainer. While completing these exercises, remember:   Restoring tissue flexibility helps normal motion to return to the joints. This allows healthier, less painful movement and activity.  An effective stretch should be held for at least 30 seconds.  A stretch should never be painful. You should only feel a gentle lengthening or release in the stretched tissue. STRETCH - Flexion, Standing  Stand with good posture. With an underhand grip on your right / left hand and an overhand grip on the opposite hand, grasp a broomstick or cane so that your hands are a little more than shoulder width apart.  Keeping your right / left elbow straight and shoulder muscles relaxed, push the stick with your opposite hand to raise your right / left arm in front of your body and then overhead. Raise your arm until you feel a stretch in your right / left shoulder, but before you have increased shoulder pain.  Try to avoid shrugging your right / left shoulder as your arm rises by keeping your shoulder blade tucked down and toward your mid-back spine. Hold __________ seconds.  Slowly return to the starting position. Repeat __________ times. Complete this exercise __________ times per day. STRETCH - Abduction, Supine  Stand with good posture. With an underhand grip on your right / left hand and an overhand grip on the opposite hand, grasp a broomstick or cane so that your hands are a little more than shoulder-width apart.  Keeping your right / left  elbow straight and shoulder muscles relaxed, push the stick with your opposite hand to raise your right / left arm out to the side of your body and then overhead. Raise your arm until you feel a stretch in your right / left shoulder, but before you have increased shoulder pain.  Try to avoid shrugging your right / left shoulder as your arm rises, by keeping your shoulder blade tucked down and toward your mid-back spine. Hold for __________ seconds.  Slowly return to the starting position. Repeat __________ times. Complete this exercise __________ times per day. ROM - Flexion, Active-Assisted  Lie on your back. You may bend your knees for comfort.  Grasp a broomstick or cane so your hands are about shoulder width apart. Your right / left hand should grip the end of the stick so that your hand is positioned "thumbs-up," as if you were about to shake hands.  Using your healthy arm to lead, raise your right / left arm overhead until you feel a gentle stretch in your shoulder. Hold for right / left seconds.  Use the stick to assist in returning your right / left arm to its starting position. Repeat __________ times. Complete this exercise __________ times per day.  STRETCH - Flexion, Standing   Stand facing a wall. Walk your right / left fingers up the wall until you feel a moderate stretch in your  shoulder. As your hand gets higher, you may need to step closer to the wall or use a door frame to walk through.  Try to avoid shrugging your right / left shoulder as your arm rises, by keeping your shoulder blade tucked down and toward your mid-back spine.  Hold for __________ seconds. Use your other hand, if needed, to ease out of the stretch and return to the starting position. Repeat __________ times. Complete this exercise __________ times per day.  ROM - Internal Rotation   Using underhand grips, grasp a stick behind your back with both hands.  While standing upright with good posture, slide  the stick up your back until you feel a mild stretch in the front of your shoulder.  Hold for __________ seconds. Slowly return to your starting position. Repeat __________ times. Complete this exercise __________ times per day.  STRETCH - Internal Rotation  Place your right / left hand behind your back, palm-up.  Throw a towel or belt over your opposite shoulder. Grasp the towel with your right / left hand.  While keeping an upright posture, gently pull up on the towel until you feel a stretch in the front of your right / left shoulder.  Avoid shrugging your right / left shoulder as your arm rises, by keeping your shoulder blade tucked down and toward your mid-back spine.  Hold for __________ seconds. Release the stretch by lowering your opposite hand. Repeat __________ times. Complete this exercise __________ times per day. STRETCH - Elbow Flexors   Lie on a firm bed or countertop on your back. Be sure that you are in a comfortable position which will allow you to relax your arm muscles.  Place a folded towel under your right / left upper arm, so that your elbow and shoulder are at the same height. Extend your arm; your elbow should not rest on the bed or towel.  Allow the weight of your hand to straighten your elbow. Keep your arm and chest muscles relaxed. Your caregiver may ask you to increase the intensity of your stretch by adding a small wrist or hand weight.  Hold for __________ seconds. You should feel a stretch on the inside of your elbow. Slowly return to the starting position. Repeat __________ times. Complete this exercise __________ times per day. STRENGTHENING EXERCISES - Biceps Tendon Disruption (Proximal) These exercises may help you regain your strength after your physician has discontinued your restraint in a cast or brace. They may resolve your symptoms with or without further involvement from your physician, physical therapist or athletic trainer. While completing  these exercises, remember:   Muscles can gain both the endurance and the strength needed for everyday activities through controlled exercises.  Complete these exercises as instructed by your physician, physical therapist or athletic trainer. Progress the resistance and repetitions only as guided.  You may experience muscle soreness or fatigue, but the pain or discomfort you are trying to eliminate should never worsen during these exercises. If this pain does get worse, stop and make sure you are following the directions exactly. If the pain is still present after adjustments, discontinue the exercise until you can discuss the trouble with your clinician. STRENGTH - Elbow Flexors, Isometric   Stand or sit upright on a firm surface. Place your right / left arm so that your hand is palm-up and at the height of your waist.  Place your opposite hand on top of your forearm. Gently push down as your right / left arm  resists. Push as hard as you can with both arms, without causing any pain or movement at your right / left elbow. Hold this stationary position for __________ seconds.  Gradually release the tension in both arms. Allow your muscles to relax completely before repeating. Repeat __________ times. Complete this exercise __________ times per day. STRENGTH - Shoulder Flexion, Isometric  With good posture and facing a wall, stand or sit about 4-6 inches away.  Keeping your right / left elbow straight, gently press the top of your fist into the wall. Increase the pressure gradually until you are pressing as hard as you can without shrugging your shoulder or increasing any shoulder discomfort.  Hold for __________ seconds.  Release the tension slowly. Relax your shoulder muscles completely before you the next repetition. Repeat __________ times. Complete this exercise __________ times per day.  STRENGTH - Elbow Flexors, Supinated  With good posture, stand or sit on a firm chair without  armrests. Allow your right / left arm to rest at your side with your palm facing forward.  Holding a __________weight or gripping a rubber exercise band or tubing, bring your hand toward your shoulder.  Allow your muscles to control the resistance as your hand returns to your side. Repeat __________ times. Complete this exercise __________ times per day.  STRENGTH - Elbow Flexors, Neutral  With good posture, stand or sit on a firm chair without armrests. Allow your right / left arm to rest at your side with your thumb facing forward.  Holding a __________ weight or gripping a rubber exercise band or tubing, bring your hand toward your shoulder.  Allow your muscles to control the resistance as your hand returns to your side. Repeat __________ times. Complete this exercise __________ times per day.  STRENGTH - Shoulder Flexion   Stand or sit with good posture. Grasp a __________ weight, or an exercise band or tubing, so that your hand is "thumbs-up," like when you shake hands.  Slowly lift your right / left arm as far as you can without increasing any shoulder pain. Initially, many people can only raise their hand to shoulder height.  Avoid shrugging your right / left shoulder as your arm rises, by keeping your shoulder blade tucked down and toward your mid-back spine.  Hold for __________ seconds. Control the descent of your hand as you slowly return to your starting position. Repeat __________ times. Complete this exercise __________ times per day.  STRENGTH - Forearm Supinators   Sit with your right / left forearm supported on a table, keeping your elbow below shoulder height. Rest your hand over the edge, palm down.  Gently grip a hammer or a soup ladle.  Without moving your elbow, slowly turn your palm and hand upward to a "thumbs-up" position.  Hold this position for __________ seconds. Slowly return to the starting position. Repeat __________ times. Complete this exercise  __________ times per day.  STRENGTH - Forearm Pronators   Sit with your right / left forearm supported on a table, keeping your elbow below shoulder height. Rest your hand over the edge, palm up.  Gently grip a hammer or a soup ladle.  Without moving your elbow, slowly turn your palm and hand upward to a "thumbs-up" position.  Hold this position for __________ seconds. Slowly return to the starting position. Repeat __________ times. Complete this exercise __________ times per day.    This information is not intended to replace advice given to you by your health care provider. Make  sure you discuss any questions you have with your health care provider.   Document Released: 12/26/2004 Document Revised: 01/16/2014 Document Reviewed: 04/09/2008 Elsevier Interactive Patient Education 2016 [employer]   Take pain medicine as needed for inflammation and tenderness. Continue to use ice and Ace wrap. Follow-up with the orthopedist if not improving.

## 2015-04-10 ENCOUNTER — Emergency Department
Admission: EM | Admit: 2015-04-10 | Discharge: 2015-04-10 | Disposition: A | Discharge status: Home / Self Care | Payer: Medicare Other | Attending: Emergency Medicine | Admitting: Emergency Medicine

## 2015-04-10 ENCOUNTER — Encounter: Payer: Self-pay | Admitting: *Deleted

## 2015-04-10 DIAGNOSIS — S5011XA Contusion of right forearm, initial encounter: Secondary | ICD-10-CM | POA: Insufficient documentation

## 2015-04-10 DIAGNOSIS — S40021A Contusion of right upper arm, initial encounter: Secondary | ICD-10-CM | POA: Insufficient documentation

## 2015-04-10 DIAGNOSIS — Y929 Unspecified place or not applicable: Secondary | ICD-10-CM | POA: Diagnosis not present

## 2015-04-10 DIAGNOSIS — S46211D Strain of muscle, fascia and tendon of other parts of biceps, right arm, subsequent encounter: Secondary | ICD-10-CM

## 2015-04-10 DIAGNOSIS — S46111D Strain of muscle, fascia and tendon of long head of biceps, right arm, subsequent encounter: Secondary | ICD-10-CM | POA: Insufficient documentation

## 2015-04-10 DIAGNOSIS — Z880 Allergy status to penicillin: Secondary | ICD-10-CM | POA: Diagnosis not present

## 2015-04-10 DIAGNOSIS — T148XXA Other injury of unspecified body region, initial encounter: Secondary | ICD-10-CM

## 2015-04-10 DIAGNOSIS — Y998 Other external cause status: Secondary | ICD-10-CM | POA: Diagnosis not present

## 2015-04-10 DIAGNOSIS — S4991XA Unspecified injury of right shoulder and upper arm, initial encounter: Secondary | ICD-10-CM | POA: Diagnosis present

## 2015-04-10 DIAGNOSIS — X58XXXA Exposure to other specified factors, initial encounter: Secondary | ICD-10-CM | POA: Diagnosis not present

## 2015-04-10 DIAGNOSIS — Y939 Activity, unspecified: Secondary | ICD-10-CM | POA: Insufficient documentation

## 2015-04-10 DIAGNOSIS — I10 Essential (primary) hypertension: Secondary | ICD-10-CM | POA: Diagnosis not present

## 2015-04-10 DIAGNOSIS — Z79899 Other long term (current) drug therapy: Secondary | ICD-10-CM | POA: Diagnosis not present

## 2015-04-10 DIAGNOSIS — Z7951 Long term (current) use of inhaled steroids: Secondary | ICD-10-CM | POA: Insufficient documentation

## 2015-04-10 NOTE — ED Notes (Signed)
Discussed discharge instructions and follow-up care with patient. No questions or concerns at this time. Pt stable at discharge.  
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
## 2015-04-10 NOTE — Discharge Instructions (Signed)
Call and  Make an appointment with Dr. Rudene Christians who is the orthopedist over [facility]. You may continue taking ibuprofen or Tylenol if needed. Bruising below your injury is to be expected and not uncommon.
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
## 2015-11-05 ENCOUNTER — Emergency Department
Admission: EM | Admit: 2015-11-05 | Discharge: 2015-11-05 | Disposition: A | Discharge status: Home / Self Care | Payer: Medicare Other | Attending: Emergency Medicine | Admitting: Emergency Medicine

## 2015-11-05 ENCOUNTER — Encounter: Payer: Self-pay | Admitting: Medical Oncology

## 2015-11-05 DIAGNOSIS — I10 Essential (primary) hypertension: Secondary | ICD-10-CM | POA: Diagnosis not present

## 2015-11-05 DIAGNOSIS — L03211 Cellulitis of face: Secondary | ICD-10-CM | POA: Insufficient documentation

## 2015-11-05 DIAGNOSIS — J449 Chronic obstructive pulmonary disease, unspecified: Secondary | ICD-10-CM | POA: Diagnosis not present

## 2015-11-05 DIAGNOSIS — Z791 Long term (current) use of non-steroidal anti-inflammatories (NSAID): Secondary | ICD-10-CM | POA: Insufficient documentation

## 2015-11-05 DIAGNOSIS — F17210 Nicotine dependence, cigarettes, uncomplicated: Secondary | ICD-10-CM | POA: Diagnosis not present

## 2015-11-05 DIAGNOSIS — Z7952 Long term (current) use of systemic steroids: Secondary | ICD-10-CM | POA: Diagnosis not present

## 2015-11-05 DIAGNOSIS — R220 Localized swelling, mass and lump, head: Secondary | ICD-10-CM | POA: Diagnosis present

## 2015-11-05 DIAGNOSIS — Z79899 Other long term (current) drug therapy: Secondary | ICD-10-CM | POA: Insufficient documentation

## 2015-11-05 DIAGNOSIS — Z792 Long term (current) use of antibiotics: Secondary | ICD-10-CM | POA: Diagnosis not present

## 2015-11-05 MED ORDER — CLINDAMYCIN HCL 150 MG PO CAPS: 150.0000 mg | ORAL_CAPSULE | Freq: Four times a day (QID) | ORAL | 0 refills | Status: DC

## 2015-11-05 MED ORDER — NAPROXEN 500 MG PO TABS: 500.0000 mg | ORAL_TABLET | Freq: Two times a day (BID) | ORAL | 0 refills | Status: DC

## 2015-11-05 MED ORDER — TRAMADOL HCL 50 MG PO TABS: 50.0000 mg | ORAL_TABLET | Freq: Four times a day (QID) | ORAL | 0 refills | Status: AC | PRN

## 2015-11-05 NOTE — ED Triage Notes (Signed)
Pt reports that he had a bump to the side of his nose that he popped and since then he has noticed redness to the left side of his face with some pain.

## 2015-11-05 NOTE — ED Notes (Signed)
NAD noted at time of D/C. Pt denies questions or concerns. Pt ambulatory to the lobby at this time.  

## 2015-11-05 NOTE — Discharge Instructions (Signed)
Take medication as directed.

## 2015-11-05 NOTE — ED Provider Notes (Signed)
[facility] Emergency Department Provider Note   ____________________________________________   First MD Initiated Contact with Patient 11/05/15 1120     (approximate)  I have reviewed the triage vital signs and the nursing notes.   HISTORY  Chief Complaint Facial Swelling    HPI Paul Haynes is a 60 y.o. male patient complaining of facial swelling secondary to eruption of a pimple lesion on the left side of his nose. Patient state  increased redness and swelling to the left side of his face.No palliative measures for this complaint. Patient describes pain as "achy". Patient rates his pain as a 1/10.   Past Medical History:  Diagnosis Date  . COPD (chronic obstructive pulmonary disease) ([place])   . Hepatitis C   . High cholesterol   . Hypertension   . WPW (Wolff-Parkinson-White syndrome)     There are no active problems to display for this patient.   Past Surgical History:  Procedure Laterality Date  . SUBDURAL HEMATOMA EVACUATION VIA CRANIOTOMY    . TONSILLECTOMY      Prior to Admission medications   Medication Sig Start Date End Date Taking? Authorizing Provider  albuterol-ipratropium (COMBIVENT) 18-103 MCG/ACT inhaler Inhale 1-2 puffs into the lungs every 4 (four) hours.    Historical Provider, MD  amLODipine (NORVASC) 10 MG tablet Take 10 mg by mouth daily.    Historical Provider, MD  buPROPion (WELLBUTRIN SR) 150 MG 12 hr tablet Take 150 mg by mouth 2 (two) times daily.    Historical Provider, MD  clindamycin (CLEOCIN) 150 MG capsule Take 1 capsule (150 mg total) by mouth 4 (four) times daily. 11/05/15   Sable Feil, PA-C  Fluticasone-Salmeterol (ADVAIR DISKUS) 100-50 MCG/DOSE AEPB Inhale 1 puff into the lungs 2 (two) times daily.    Historical Provider, MD  guaiFENesin-codeine 100-10 MG/5ML syrup Take 10 mLs by mouth every 6 (six) hours as needed for cough. Patient not taking: Reported on 01/13/2015 06/23/14   Harvest Dark, MD    hydrochlorothiazide (HYDRODIURIL) 25 MG tablet Take 25 mg by mouth daily.    Historical Provider, MD  HYDROcodone-acetaminophen (NORCO) 5-325 MG tablet Take 1 tablet by mouth every 4 (four) hours as needed for moderate pain. 04/01/15   Mortimer Fries, PA-C  ibuprofen (ADVIL,MOTRIN) 800 MG tablet Take 1 tablet (800 mg total) by mouth every 8 (eight) hours as needed. 04/01/15   Mortimer Fries, PA-C  levofloxacin (LEVAQUIN) 750 MG tablet Take 1 tablet (750 mg total) by mouth daily. Patient not taking: Reported on 01/13/2015 06/13/14   Carrie Mew, MD  metoprolol tartrate (LOPRESSOR) 25 MG tablet Take 25 mg by mouth 2 (two) times daily.    Historical Provider, MD  naproxen (NAPROSYN) 500 MG tablet Take 1 tablet (500 mg total) by mouth 2 (two) times daily with a meal. 11/05/15   Sable Feil, PA-C  omeprazole (PRILOSEC) 20 MG capsule Take 20 mg by mouth daily.    Historical Provider, MD  ondansetron (ZOFRAN ODT) 4 MG disintegrating tablet Take 1 tablet (4 mg total) by mouth every 8 (eight) hours as needed for nausea or vomiting. 01/13/15   Harvest Dark, MD  pravastatin (PRAVACHOL) 20 MG tablet Take 20 mg by mouth daily.    Historical Provider, MD  predniSONE (DELTASONE) 20 MG tablet Take 2 tablets (40 mg total) by mouth daily. Patient not taking: Reported on 01/13/2015 06/13/14   Carrie Mew, MD  traMADol (ULTRAM) 50 MG tablet Take 1 tablet (50 mg total) by mouth  every 6 (six) hours as needed. 11/05/15 11/04/16  Sable Feil, PA-C    Allergies Penicillins  No family history on file.  Social History Social History  Substance Use Topics  . Smoking status: Current Every Day Smoker    Packs/day: 1.00    Types: Cigarettes  . Smokeless tobacco: Never Used  . Alcohol use No    Review of Systems Constitutional: No fever/chills Eyes: No visual changes. ENT: No sore throat. Cardiovascular: Denies chest pain. Respiratory: Denies shortness of breath. Gastrointestinal: No abdominal pain.  No  nausea, no vomiting.  No diarrhea.  No constipation. Genitourinary: Negative for dysuria. Musculoskeletal: Negative for back pain. Skin: Negative for rash. Neurological: Negative for headaches, focal weakness or numbness. Endocrine:Hypertension and hyperlipidemia. Hematological/Lymphatic: Hepatitis C Allergic/Immunilogical: Penicillin  ____________________________________________   PHYSICAL EXAM:  VITAL SIGNS: ED Triage Vitals  Enc Vitals Group     BP 11/05/15 1036 (!) 151/67     Pulse Rate 11/05/15 1036 (!) 53     Resp 11/05/15 1036 20     Temp 11/05/15 1036 97.7 F (36.5 C)     Temp Source 11/05/15 1036 Oral     SpO2 11/05/15 1036 96 %     Weight 11/05/15 1037 175 lb (79.4 kg)     Height 11/05/15 1037 5\' 7"  (1.702 m)     Head Circumference --      Peak Flow --      Pain Score 11/05/15 1040 1     Pain Loc --      Pain Edu? --      Excl. in [place]? --     Constitutional: Alert and oriented. Well appearing and in no acute distress. Eyes: Conjunctivae are normal. PERRL. EOMI. Head: Atraumatic. Nose: No congestion/rhinnorhea. Mouth/Throat: Mucous membranes are moist.  Oropharynx non-erythematous. Neck: No stridor.  No cervical spine tenderness to palpation. Hematological/Lymphatic/Immunilogical: No cervical lymphadenopathy. Cardiovascular: Normal rate, regular rhythm. Grossly normal heart sounds.  Good peripheral circulation. Respiratory: Normal respiratory effort.  No retractions. Lungs CTAB. Gastrointestinal: Soft and nontender. No distention. No abdominal bruits. No CVA tenderness. Musculoskeletal: No lower extremity tenderness nor edema.  No joint effusions. Neurologic:  Normal speech and language. No gross focal neurologic deficits are appreciated. No gait instability. Skin:  Skin is warm, dry and intact. No rash noted. Psychiatric: Mood and affect are normal. Speech and behavior are normal.  ____________________________________________   LABS (all labs ordered are  listed, but only abnormal results are displayed)  Labs Reviewed - No data to display ____________________________________________  EKG   ____________________________________________  RADIOLOGY   ____________________________________________   PROCEDURES  Procedure(s) performed: None  Procedures  Critical Care performed: No  ____________________________________________   INITIAL IMPRESSION / ASSESSMENT AND PLAN / ED COURSE  Pertinent labs & imaging results that were available during my care of the patient were reviewed by me and considered in my medical decision making (see chart for details).  Facial cellulitis. Patient given discharge care instructions. Patient given prescription for clindamycin and tramadol.  Clinical Course     ____________________________________________   FINAL CLINICAL IMPRESSION(S) / ED DIAGNOSES  Final diagnoses:  Left facial swelling  Cellulitis of face      NEW MEDICATIONS STARTED DURING THIS VISIT:  New Prescriptions   CLINDAMYCIN (CLEOCIN) 150 MG CAPSULE    Take 1 capsule (150 mg total) by mouth 4 (four) times daily.   NAPROXEN (NAPROSYN) 500 MG TABLET    Take 1 tablet (500 mg total) by mouth 2 (two) times daily  with a meal.   TRAMADOL (ULTRAM) 50 MG TABLET    Take 1 tablet (50 mg total) by mouth every 6 (six) hours as needed.     Note:  This document was prepared using Dragon voice recognition software and may include unintentional dictation errors.    Sable Feil, PA-C 11/05/15 1134    Lavonia Drafts, MD 11/05/15 [phone]

## 2016-05-06 ENCOUNTER — Encounter: Payer: Self-pay | Admitting: Emergency Medicine

## 2016-05-06 ENCOUNTER — Emergency Department
Admission: EM | Admit: 2016-05-06 | Discharge: 2016-05-06 | Disposition: A | Discharge status: Home / Self Care | Payer: Medicare Other | Attending: Emergency Medicine | Admitting: Emergency Medicine

## 2016-05-06 DIAGNOSIS — Z79899 Other long term (current) drug therapy: Secondary | ICD-10-CM | POA: Insufficient documentation

## 2016-05-06 DIAGNOSIS — I10 Essential (primary) hypertension: Secondary | ICD-10-CM | POA: Diagnosis not present

## 2016-05-06 DIAGNOSIS — J449 Chronic obstructive pulmonary disease, unspecified: Secondary | ICD-10-CM | POA: Insufficient documentation

## 2016-05-06 DIAGNOSIS — L0201 Cutaneous abscess of face: Secondary | ICD-10-CM | POA: Diagnosis not present

## 2016-05-06 DIAGNOSIS — F17210 Nicotine dependence, cigarettes, uncomplicated: Secondary | ICD-10-CM | POA: Diagnosis not present

## 2016-05-06 DIAGNOSIS — Z5321 Procedure and treatment not carried out due to patient leaving prior to being seen by health care provider: Secondary | ICD-10-CM | POA: Insufficient documentation

## 2016-05-06 HISTORY — DX: Hereditary factor VIII deficiency: D66

## 2016-05-06 NOTE — ED Triage Notes (Signed)
Pt arrives ambulatory to triage with c/o right lower jaw abscess x3 weeks. Pt reports that the abscess has grown over time. Pt is able to swallow, talk in complete sentences, and is in NAD at this time.

## 2016-05-07 ENCOUNTER — Encounter: Payer: Self-pay | Admitting: Emergency Medicine

## 2016-05-07 ENCOUNTER — Emergency Department: Payer: Medicare Other

## 2016-05-07 ENCOUNTER — Emergency Department
Admission: EM | Admit: 2016-05-07 | Discharge: 2016-05-07 | Disposition: A | Discharge status: Home / Self Care | Payer: Medicare Other | Attending: Emergency Medicine | Admitting: Emergency Medicine

## 2016-05-07 DIAGNOSIS — F17210 Nicotine dependence, cigarettes, uncomplicated: Secondary | ICD-10-CM | POA: Diagnosis not present

## 2016-05-07 DIAGNOSIS — I10 Essential (primary) hypertension: Secondary | ICD-10-CM | POA: Insufficient documentation

## 2016-05-07 DIAGNOSIS — R591 Generalized enlarged lymph nodes: Secondary | ICD-10-CM

## 2016-05-07 DIAGNOSIS — Z79899 Other long term (current) drug therapy: Secondary | ICD-10-CM | POA: Insufficient documentation

## 2016-05-07 DIAGNOSIS — J449 Chronic obstructive pulmonary disease, unspecified: Secondary | ICD-10-CM | POA: Diagnosis not present

## 2016-05-07 DIAGNOSIS — R599 Enlarged lymph nodes, unspecified: Secondary | ICD-10-CM | POA: Diagnosis not present

## 2016-05-07 DIAGNOSIS — R220 Localized swelling, mass and lump, head: Secondary | ICD-10-CM | POA: Diagnosis present

## 2016-05-07 HISTORY — DX: Malignant (primary) neoplasm, unspecified: C80.1

## 2016-05-07 LAB — CBC WITH DIFFERENTIAL/PLATELET
Basophils Absolute: 0.1 10*3/uL (ref 0–0.1)
Basophils Relative: 1 %
EOS ABS: 0.2 10*3/uL (ref 0–0.7)
Eosinophils Relative: 4 %
HEMATOCRIT: 45.2 % (ref 40.0–52.0)
HEMOGLOBIN: 15.2 g/dL (ref 13.0–18.0)
LYMPHS ABS: 1.9 10*3/uL (ref 1.0–3.6)
LYMPHS PCT: 28 %
MCH: 27.5 pg (ref 26.0–34.0)
MCHC: 33.6 g/dL (ref 32.0–36.0)
MCV: 81.7 fL (ref 80.0–100.0)
MONOS PCT: 10 %
Monocytes Absolute: 0.7 10*3/uL (ref 0.2–1.0)
NEUTROS ABS: 3.7 10*3/uL (ref 1.4–6.5)
NEUTROS PCT: 57 %
Platelets: 221 10*3/uL (ref 150–440)
RBC: 5.53 MIL/uL (ref 4.40–5.90)
RDW: 14.3 % (ref 11.5–14.5)
WBC: 6.5 10*3/uL (ref 3.8–10.6)

## 2016-05-07 LAB — BASIC METABOLIC PANEL
Anion gap: 8 (ref 5–15)
BUN: 12 mg/dL (ref 6–20)
CHLORIDE: 92 mmol/L — AB (ref 101–111)
CO2: 33 mmol/L — AB (ref 22–32)
CREATININE: 0.99 mg/dL (ref 0.61–1.24)
Calcium: 9.1 mg/dL (ref 8.9–10.3)
GFR calc Af Amer: >60 mL/min (ref >60)
GFR calc non Af Amer: >60 mL/min (ref >60)
Glucose, Bld: 129 mg/dL — ABNORMAL HIGH (ref 65–99)
POTASSIUM: 3.8 mmol/L (ref 3.5–5.1)
SODIUM: 133 mmol/L — AB (ref 135–145)

## 2016-05-07 MED ORDER — CLINDAMYCIN HCL 300 MG PO CAPS: 300.0000 mg | ORAL_CAPSULE | Freq: Three times a day (TID) | ORAL | 0 refills | Status: AC

## 2016-05-07 MED ORDER — IOPAMIDOL (ISOVUE-300) INJECTION 61%
75.0000 mL | Freq: Once | INTRAVENOUS | Status: AC | PRN
  Administered 2016-05-07: 75 mL via INTRAVENOUS
  Filled 2016-05-07: qty 75

## 2016-05-07 NOTE — ED Provider Notes (Addendum)
[facility] Emergency Department Provider Note  ____________________________________________   I have reviewed the triage vital signs and the nursing notes.   HISTORY  Chief Complaint Facial Pain    HPI Paul Haynes is a 61 y.o. 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 He states that the lesion on his jaw starting to become painful. He has not had any nausea vomiting or fever. He is edentulous on the bottom.      Past Medical History:  Diagnosis Date  . COPD (chronic obstructive pulmonary disease) ([place])   . 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 Hepatitis C   . High cholesterol   . Hypertension   . WPW (Wolff-Parkinson-White syndrome)     There are no active problems to display for this patient.   Past Surgical History:  Procedure Laterality Date  . SUBDURAL HEMATOMA EVACUATION VIA CRANIOTOMY    . TONSILLECTOMY      Prior to Admission medications   Medication Sig Start Date End Date Taking? Authorizing Provider  albuterol-ipratropium (COMBIVENT) 18-103 MCG/ACT inhaler Inhale 1-2 puffs into the lungs every 4 (four) hours.    Historical Provider, MD  amLODipine (NORVASC) 10 MG tablet Take 10 mg by mouth daily.    Historical Provider, MD  buPROPion (WELLBUTRIN SR) 150 MG 12 hr tablet Take 150 mg by mouth 2 (two) times daily.    Historical Provider, MD  clindamycin (CLEOCIN) 150 MG capsule Take 1 capsule (150 mg total) by mouth 4 (four) times daily. 11/05/15   Sable Feil, PA-C  Fluticasone-Salmeterol (ADVAIR DISKUS) 100-50 MCG/DOSE AEPB Inhale 1 puff into the lungs 2 (two) times daily.    Historical Provider, MD  guaiFENesin-codeine 100-10 MG/5ML syrup Take 10 mLs by mouth every 6 (six) hours as needed for cough. Patient not taking: Reported on 01/13/2015 06/23/14   Harvest Dark, MD  hydrochlorothiazide (HYDRODIURIL) 25 MG tablet Take 25 mg by mouth daily.    Historical Provider, MD  HYDROcodone-acetaminophen (NORCO) 5-325 MG tablet Take 1 tablet by mouth every 4 (four) hours as needed for moderate pain. 04/01/15   Mortimer Fries, PA-C  ibuprofen (ADVIL,MOTRIN) 800 MG tablet Take 1 tablet (800 mg total) by mouth every 8 (eight) hours as needed. 04/01/15   Mortimer Fries, PA-C  levofloxacin (LEVAQUIN) 750 MG tablet Take 1 tablet (750 mg total) by mouth daily. Patient not taking: Reported on 01/13/2015 06/13/14   Carrie Mew, MD  metoprolol tartrate (LOPRESSOR) 25 MG tablet Take 25 mg by mouth 2 (two) times daily.    Historical Provider, MD  naproxen (NAPROSYN) 500 MG tablet Take 1 tablet (500 mg total) by mouth 2 (two) times daily with a meal. 11/05/15   Sable Feil, PA-C  omeprazole (PRILOSEC) 20 MG capsule Take 20 mg by mouth daily.    Historical Provider, MD  ondansetron (ZOFRAN ODT) 4 MG disintegrating tablet Take 1 tablet (4 mg total) by mouth every 8 (eight) hours as needed for nausea or vomiting. 01/13/15   Harvest Dark, MD  pravastatin (PRAVACHOL) 20 MG tablet Take 20 mg by mouth daily.    Historical Provider, MD  predniSONE (DELTASONE) 20 MG tablet Take 2 tablets (40 mg total) by mouth daily. Patient not taking: Reported on 01/13/2015 06/13/14   Carrie Mew, MD  traMADol (ULTRAM) 50 MG tablet Take 1  tablet (50 mg total) by mouth every 6 (six) hours as needed. 11/05/15 11/04/16  Sable Feil, PA-C    Allergies Penicillins  History reviewed. No pertinent family history.  Social History Social History  Substance Use Topics  . Smoking status: Current Every Day Smoker    Packs/day: 1.00    Types: Cigarettes  . Smokeless tobacco: Never Used  . Alcohol use No    Review of Systems Constitutional: No fever/chills Eyes: No visual changes. ENT: No sore throat. No stiff neck no neck pain Cardiovascular: Denies chest pain. Respiratory: Denies  shortness of breath. Gastrointestinal:   no vomiting.  No diarrhea.  No constipation. Genitourinary: Negative for dysuria. Musculoskeletal: Negative lower extremity swelling Skin: Negative for rash. Neurological: Negative for severe headaches, focal weakness or numbness. 10-point ROS otherwise negative.  ____________________________________________   PHYSICAL EXAM:  VITAL SIGNS: ED Triage Vitals  Enc Vitals Group     BP 05/07/16 1500 125/66     Pulse Rate 05/07/16 1500 (!) 59     Resp 05/07/16 1500 18     Temp 05/07/16 1500 97.4 F (36.3 C)     Temp Source 05/07/16 1500 Oral     SpO2 05/07/16 1500 97 %     Weight 05/07/16 1501 170 lb (77.1 kg)     Height 05/07/16 1501 5\' 7"  (1.702 m)     Head Circumference --      Peak Flow --      Pain Score 05/07/16 1512 6     Pain Loc --      Pain Edu? --      Excl. in [place]? --     Constitutional: Alert and oriented. Well appearing and in no acute distress. Eyes: Conjunctivae are normal. PERRL. EOMI. Head: Atraumatic. Nose: No congestion/rhinnorhea. Mouth/Throat: Mucous membranes are moist.  Oropharynx non-erythematous., There is significant swelling about 4 x 3 cm to the right jaw which is mildly tender and hard. Neck: No stridor.   Nontender with no meningismusNo significant lymphadenopathy Cardiovascular: Normal rate, regular rhythm. Grossly normal heart sounds.  Good peripheral circulation. Respiratory: Normal respiratory effort.  No retractions. Lungs CTAB. Abdominal: Soft and nontender. No distention. No guarding no rebound Back:  There is no focal tenderness or step off.  there is no midline tenderness there are no lesions noted. there is no CVA tenderness Musculoskeletal: No lower extremity tenderness, no upper extremity tenderness. No joint effusions, no DVT signs strong distal pulses no edema Neurologic:  Normal speech and language. No gross focal neurologic deficits are appreciated.  Skin:  Skin is warm, dry and intact. No  rash noted. Psychiatric: Mood and affect are normal. Speech and behavior are normal.  ____________________________________________   LABS (all labs ordered are listed, but only abnormal results are displayed)  Labs Reviewed  BASIC METABOLIC PANEL - Abnormal; Notable for the following:       Result Value   Sodium 133 (*)    Chloride 92 (*)    CO2 33 (*)    Glucose, Bld 129 (*)    All other components within normal limits  CBC WITH DIFFERENTIAL/PLATELET   ____________________________________________  EKG  I personally interpreted any EKGs ordered by me or triage  ____________________________________________  RADIOLOGY  I reviewed any imaging ordered by me or triage that were performed during my shift and, if possible, patient and/or family made aware of any abnormal findings. ____________________________________________   PROCEDURES  Procedure(s) performed: None  Procedures  Critical Care performed: None  ____________________________________________  INITIAL IMPRESSION / ASSESSMENT AND PLAN / ED COURSE  Pertinent labs & imaging results that were available during my care of the patient were reviewed by me and considered in my medical decision making (see chart for details).  Patient is concerned he has an abscess, I am concerned that he may have a recurrence of his cancer. We will obtain CT scan with contrast to further evaluate and differentiate the 2. He most likely will need follow-up closely with his ENT at [facility]  ----------------------------------------- 6:44 PM on 05/07/2016 -----------------------------------------  I was able to talk to [facility] ENT on call for Dr. Neldon Labella, Dr. Jerilee Hoh, who agrees with management. He does request that we send the patient home with clindamycin in case there is an infectious component to this. He and I discussed the patient's finding, physical exam CT scan and blood work and history. They do not wish me to do an I&D in fact  they feel that this is certainly more likely to cause the patient harm than good. Patient is in no acute distress. It's really does not look infected is not red, minimally tender and not really fluctuant, and there for nearly a month patient states. We will have him closely follow up with his ENT patient made aware of all these findings and is very comfortable with the plan.    ____________________________________________   FINAL CLINICAL IMPRESSION(S) / ED DIAGNOSES  Final diagnoses:  None      This chart was dictated using voice recognition software.  Despite best efforts to proofread,  errors can occur which can change meaning.      Schuyler Amor, MD 05/07/16 [place], MD 05/07/16 [phone]

## 2016-05-07 NOTE — Discharge Instructions (Signed)
Her CT scan is concerning for an lymph node that is swollen. This could represent cancer coming back as we discussed. Take the clindamycin as prescribed. Give increased pain, swelling, shortness of breath, difficulty swallowing, difficulty breathing, fever, or you feel worse in any way please return to emergency attention. Otherwise, do not fail to call your ENT doctor first thing on Monday morning. You  should also make your cancer doctor aware.

## 2016-05-07 NOTE — ED Triage Notes (Signed)
Pt presents to ED with swelling to R lower jaw at this time. Pt states has been going on x 3 weeks. Pt is visualized in NAD at this time.

## 2016-09-26 ENCOUNTER — Emergency Department: Payer: Medicare Other

## 2016-09-26 ENCOUNTER — Other Ambulatory Visit: Payer: Self-pay

## 2016-09-26 ENCOUNTER — Emergency Department
Admission: EM | Admit: 2016-09-26 | Discharge: 2016-09-26 | Disposition: A | Discharge status: Home / Self Care | Payer: Medicare Other | Attending: Emergency Medicine | Admitting: Emergency Medicine

## 2016-09-26 ENCOUNTER — Encounter: Payer: Self-pay | Admitting: Emergency Medicine

## 2016-09-26 DIAGNOSIS — S2232XA Fracture of one rib, left side, initial encounter for closed fracture: Secondary | ICD-10-CM | POA: Diagnosis not present

## 2016-09-26 DIAGNOSIS — Y939 Activity, unspecified: Secondary | ICD-10-CM | POA: Diagnosis not present

## 2016-09-26 DIAGNOSIS — F17210 Nicotine dependence, cigarettes, uncomplicated: Secondary | ICD-10-CM | POA: Insufficient documentation

## 2016-09-26 DIAGNOSIS — Z859 Personal history of malignant neoplasm, unspecified: Secondary | ICD-10-CM | POA: Diagnosis not present

## 2016-09-26 DIAGNOSIS — M545 Low back pain: Secondary | ICD-10-CM | POA: Insufficient documentation

## 2016-09-26 DIAGNOSIS — Y929 Unspecified place or not applicable: Secondary | ICD-10-CM | POA: Insufficient documentation

## 2016-09-26 DIAGNOSIS — Y999 Unspecified external cause status: Secondary | ICD-10-CM | POA: Diagnosis not present

## 2016-09-26 DIAGNOSIS — J449 Chronic obstructive pulmonary disease, unspecified: Secondary | ICD-10-CM | POA: Insufficient documentation

## 2016-09-26 DIAGNOSIS — S299XXA Unspecified injury of thorax, initial encounter: Secondary | ICD-10-CM | POA: Diagnosis present

## 2016-09-26 DIAGNOSIS — Z79899 Other long term (current) drug therapy: Secondary | ICD-10-CM | POA: Insufficient documentation

## 2016-09-26 DIAGNOSIS — I10 Essential (primary) hypertension: Secondary | ICD-10-CM | POA: Diagnosis not present

## 2016-09-26 DIAGNOSIS — W108XXA Fall (on) (from) other stairs and steps, initial encounter: Secondary | ICD-10-CM | POA: Diagnosis not present

## 2016-09-26 DIAGNOSIS — W19XXXA Unspecified fall, initial encounter: Secondary | ICD-10-CM

## 2016-09-26 MED ORDER — LIDOCAINE 5 % EX PTCH: 1.0000 | MEDICATED_PATCH | Freq: Two times a day (BID) | CUTANEOUS | 0 refills | Status: AC

## 2016-09-26 MED ORDER — LIDOCAINE 5 % EX PTCH
1.0000 | MEDICATED_PATCH | CUTANEOUS | Status: DC
  Filled 2016-09-26: qty 1

## 2016-09-26 NOTE — ED Provider Notes (Signed)
[facility] Emergency Department Provider Note  ____________________________________________  Time seen: Approximately 5:57 PM  I have reviewed the triage vital signs and the nursing notes.   HISTORY  Chief Complaint Back Pain and Chest Pain (rib pain r/t fall 4 days ago )    HPI Paul Haynes is a 61 y.o. male that presents to emergency department with left-sided rib pain and right sided low back pain after falling 3 days ago. He fell down about 4 steps because he tripped a puddle of water. He did not hit his head or lose consciousness.Patient is concerned that he broke a rib. He states that pain is worse with deep breathing. He is also having pain in his low right back. It does not radiate. No bowel or bladder dysfunction or saddle paresthesias. No alleviating measures have been attempted. No headache, nausea, vomiting, abdominal pain, numbness, tingling.   Past Medical History:  Diagnosis Date  . Cancer ([place])   . COPD (chronic obstructive pulmonary disease) ([place])   . 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 Hepatitis C   . High cholesterol   . Hypertension   . WPW (Wolff-Parkinson-White syndrome)     There are no active problems to display for this patient.   Past Surgical History:  Procedure Laterality Date  . SUBDURAL HEMATOMA EVACUATION VIA CRANIOTOMY    . TONSILLECTOMY      Prior to Admission medications   Medication Sig Start Date End Date Taking? Authorizing Provider  albuterol-ipratropium (COMBIVENT) 18-103 MCG/ACT inhaler Inhale 1-2 puffs into the lungs every 4 (four) hours.    [provider]  amLODipine (NORVASC) 10 MG tablet Take 10 mg by mouth daily.    [provider]  buPROPion (WELLBUTRIN SR) 150 MG 12 hr tablet Take 150 mg by mouth 2 (two) times daily.    [provider]  Fluticasone-Salmeterol (ADVAIR DISKUS) 100-50 MCG/DOSE AEPB Inhale 1 puff into the lungs 2 (two) times daily.    [provider]   guaiFENesin-codeine 100-10 MG/5ML syrup Take 10 mLs by mouth every 6 (six) hours as needed for cough. Patient not taking: Reported on 01/13/2015 06/23/14   Harvest Dark, MD  hydrochlorothiazide (HYDRODIURIL) 25 MG tablet Take 25 mg by mouth daily.    [provider]  HYDROcodone-acetaminophen (NORCO) 5-325 MG tablet Take 1 tablet by mouth every 4 (four) hours as needed for moderate pain. 04/01/15   Mortimer Fries, PA-C  ibuprofen (ADVIL,MOTRIN) 800 MG tablet Take 1 tablet (800 mg total) by mouth every 8 (eight) hours as needed. 04/01/15   Mortimer Fries, PA-C  levofloxacin (LEVAQUIN) 750 MG tablet Take 1 tablet (750 mg total) by mouth daily. Patient not taking: Reported on 01/13/2015 06/13/14   Carrie Mew, MD  lidocaine (LIDODERM) 5 % Place 1 patch onto the skin every 12 (twelve) hours. Remove & Discard patch within 12 hours or as directed by MD 09/26/16 09/26/17  Laban Emperor, PA-C  metoprolol tartrate (LOPRESSOR) 25 MG tablet Take 25 mg by mouth 2 (two) times daily.    [provider]  naproxen (NAPROSYN) 500 MG tablet Take 1 tablet (500 mg total) by mouth 2 (two) times daily with a meal. 11/05/15   Sable Feil, PA-C  omeprazole (PRILOSEC) 20 MG capsule Take 20 mg by mouth daily.    [provider]  ondansetron (ZOFRAN ODT) 4 MG disintegrating tablet Take 1 tablet (4 mg total) by mouth every 8 (eight) hours as needed for nausea or vomiting. 01/13/15   Paduchowski,  Lennette Bihari, MD  pravastatin (PRAVACHOL) 20 MG tablet Take 20 mg by mouth daily.    [provider]  predniSONE (DELTASONE) 20 MG tablet Take 2 tablets (40 mg total) by mouth daily. Patient not taking: Reported on 01/13/2015 06/13/14   Carrie Mew, MD  traMADol (ULTRAM) 50 MG tablet Take 1 tablet (50 mg total) by mouth every 6 (six) hours as needed. 11/05/15 11/04/16  Sable Feil, PA-C    Allergies Penicillins  History reviewed. No pertinent family history.  Social History Social History   Substance Use Topics  . Smoking status: Current Every Day Smoker    Packs/day: 1.00    Types: Cigarettes  . Smokeless tobacco: Never Used  . Alcohol use No     Review of Systems  Constitutional: No fever/chills Cardiovascular: No chest pain. Respiratory: No SOB. Gastrointestinal: No abdominal pain.  No nausea, no vomiting.  Musculoskeletal: See above. Skin: Negative for rash, abrasions, lacerations, ecchymosis. Neurological: Negative for headaches, numbness or tingling   ____________________________________________   PHYSICAL EXAM:  VITAL SIGNS: ED Triage Vitals  Enc Vitals Group     BP 09/26/16 1648 (!) 133/58     Pulse Rate 09/26/16 1648 (!) 55     Resp 09/26/16 1648 17     Temp 09/26/16 1648 97.9 F (36.6 C)     Temp Source 09/26/16 1648 Oral     SpO2 09/26/16 1648 97 %     Weight 09/26/16 1648 168 lb (76.2 kg)     Height 09/26/16 1648 5\' 7"  (1.702 m)     Head Circumference --      Peak Flow --      Pain Score 09/26/16 1650 8     Pain Loc --      Pain Edu? --      Excl. in [place]? --      Constitutional: Alert and oriented. Well appearing and in no acute distress. Eyes: Conjunctivae are normal. PERRL. EOMI. Head: Atraumatic. ENT:      Ears:      Nose: No congestion/rhinnorhea.      Mouth/Throat: Mucous membranes are moist.  Neck: No stridor. No cervical spine tenderness to palpation. Cardiovascular: Normal rate, regular rhythm.  Good peripheral circulation. Respiratory: Normal respiratory effort without tachypnea or retractions. Lungs CTAB. Good air entry to the bases with no decreased or absent breath sounds. Gastrointestinal: Bowel sounds 4 quadrants. Soft and nontender to palpation. No guarding or rigidity. No palpable masses. No distention. No  Musculoskeletal: Full range of motion to all extremities. No gross deformities appreciated. Tenderness to palpation over left lateral rib cage. Tenderness to palpation over right paraspinal muscles. No tenderness  to palpation over lumbar spine. Strength 5 out of 5 in lower extremities bilaterally. Neurologic:  Normal speech and language. No gross focal neurologic deficits are appreciated.  Skin:  Skin is warm, dry and intact. No rash noted.   ____________________________________________   LABS (all labs ordered are listed, but only abnormal results are displayed)  Labs Reviewed - No data to display ____________________________________________  EKG   ____________________________________________  RADIOLOGY Robinette Haines, personally viewed and evaluated these images (plain radiographs) as part of my medical decision making, as well as reviewing the written report by the radiologist.  Dg Chest 2 View  Result Date: 09/26/2016 CLINICAL DATA:  Fall. EXAM: CHEST  2 VIEW COMPARISON:  06/23/2014. FINDINGS: Hilar structures are unremarkable. Contour abnormality again noted about the right cardiophrenic angle consistent with prominent fat pad.Heart size normal. Lungs are  clear. No pleural effusion or pneumothorax. Degenerative changes thoracic spine. Surgical clips right neck. IMPRESSION: No acute cardiopulmonary disease. Electronically Signed   By: Marcello Moores  Register   On: 09/26/2016 17:25   Dg Ribs Unilateral Left  Result Date: 09/26/2016 CLINICAL DATA:  Pain after fall two days ago. EXAM: LEFT RIBS - 2 VIEW COMPARISON:  Chest x-ray September 26, 2016 FINDINGS: No pneumothorax. There is a fracture through the lateral seventh rib on the left. No other abnormalities. IMPRESSION: Nondisplaced fracture through the lateral left seventh rib. No pneumothorax. Electronically Signed   By: Dorise Bullion III M.D   On: 09/26/2016 18:02    ____________________________________________    PROCEDURES  Procedure(s) performed:    Procedures    Medications  lidocaine (LIDODERM) 5 % 1 patch (not administered)     ____________________________________________   INITIAL IMPRESSION / ASSESSMENT AND PLAN /  ED COURSE  Pertinent labs & imaging results that were available during my care of the patient were reviewed by me and considered in my medical decision making (see chart for details).  Review of the [place] CSRS was performed in accordance of the [place] prior to dispensing any controlled drugs.  Patient presented to the emergency department for evaluation after falling 3 days ago. Vital signs and exam are reassuring. No changes on EKG. Chest and rib x-ray indicate seventh rib fracture. Patient has been given several prescriptions for oxycodone and Suboxone so I told him that I could not write him a narcotic for pain. Lidoderm patch was applied. Patient will be discharged home with prescriptions for Lidoderm. Patient is to follow up with PCP as directed. Patient is given ED precautions to return to the ED for any worsening or new symptoms.     ____________________________________________  FINAL CLINICAL IMPRESSION(S) / ED DIAGNOSES  Final diagnoses:  Fall  Closed fracture of one rib of left side, initial encounter      NEW MEDICATIONS STARTED DURING THIS VISIT:  Discharge Medication List as of 09/26/2016  6:22 PM    START taking these medications   Details  lidocaine (LIDODERM) 5 % Place 1 patch onto the skin every 12 (twelve) hours. Remove & Discard patch within 12 hours or as directed by MD, Starting Tue 09/26/2016, Until Wed 09/26/2017, Print            This chart was dictated using voice recognition software/Dragon. Despite best efforts to proofread, errors can occur which can change the meaning. Any change was purely unintentional.    Laban Emperor, PA-C 09/26/16 1842    Darel Hong, MD 09/26/16 [phone]

## 2016-09-26 NOTE — ED Notes (Signed)
See triage note  States he fell on Sunday    Hit back on steps  conts to have pain with movement and inspiration

## 2016-09-26 NOTE — ED Triage Notes (Signed)
Pt presents to ED c/o left rib pain with back pain post fall on steps Sunday night. Pt admits to pain on inspiration and with coughing r/t rib pain.

## 2016-09-26 NOTE — ED Triage Notes (Signed)
FIRST NURSE NOTE-pt fell down 4 stairs few days ago. Pain to left ribs. NAD. Respirations unlabored.

## 2017-04-01 ENCOUNTER — Emergency Department: Payer: Medicare Other

## 2017-04-01 ENCOUNTER — Emergency Department
Admission: EM | Admit: 2017-04-01 | Discharge: 2017-04-01 | Disposition: A | Discharge status: Home / Self Care | Payer: Medicare Other | Attending: Emergency Medicine | Admitting: Emergency Medicine

## 2017-04-01 ENCOUNTER — Other Ambulatory Visit: Payer: Self-pay

## 2017-04-01 DIAGNOSIS — Z79899 Other long term (current) drug therapy: Secondary | ICD-10-CM | POA: Diagnosis not present

## 2017-04-01 DIAGNOSIS — F17210 Nicotine dependence, cigarettes, uncomplicated: Secondary | ICD-10-CM | POA: Insufficient documentation

## 2017-04-01 DIAGNOSIS — Y929 Unspecified place or not applicable: Secondary | ICD-10-CM | POA: Diagnosis not present

## 2017-04-01 DIAGNOSIS — J449 Chronic obstructive pulmonary disease, unspecified: Secondary | ICD-10-CM | POA: Insufficient documentation

## 2017-04-01 DIAGNOSIS — Z859 Personal history of malignant neoplasm, unspecified: Secondary | ICD-10-CM | POA: Diagnosis not present

## 2017-04-01 DIAGNOSIS — S40012A Contusion of left shoulder, initial encounter: Secondary | ICD-10-CM | POA: Diagnosis not present

## 2017-04-01 DIAGNOSIS — I10 Essential (primary) hypertension: Secondary | ICD-10-CM | POA: Diagnosis not present

## 2017-04-01 DIAGNOSIS — Y999 Unspecified external cause status: Secondary | ICD-10-CM | POA: Diagnosis not present

## 2017-04-01 DIAGNOSIS — S4992XA Unspecified injury of left shoulder and upper arm, initial encounter: Secondary | ICD-10-CM | POA: Diagnosis present

## 2017-04-01 DIAGNOSIS — Y9389 Activity, other specified: Secondary | ICD-10-CM | POA: Diagnosis not present

## 2017-04-01 DIAGNOSIS — M25512 Pain in left shoulder: Secondary | ICD-10-CM

## 2017-04-01 MED ORDER — HYDROCODONE-ACETAMINOPHEN 5-325 MG PO TABS
1.0000 | ORAL_TABLET | Freq: Once | ORAL | Status: AC
  Administered 2017-04-01: 1 via ORAL
  Filled 2017-04-01: qty 1
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
## 2017-04-01 NOTE — ED Notes (Signed)
This RN reviewed discharge instructions, follow-up care, cryotherapy, and need for elevation with patient. Patient verbalized understanding of all reviewed information.  Patient stable, with no distress noted at this time.
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
## 2017-04-04 ENCOUNTER — Emergency Department
Admission: EM | Admit: 2017-04-04 | Discharge: 2017-04-04 | Disposition: A | Discharge status: Home / Self Care | Payer: Medicare Other | Attending: Emergency Medicine | Admitting: Emergency Medicine

## 2017-04-04 ENCOUNTER — Other Ambulatory Visit: Payer: Self-pay

## 2017-04-04 DIAGNOSIS — S199XXD Unspecified injury of neck, subsequent encounter: Secondary | ICD-10-CM | POA: Diagnosis present

## 2017-04-04 DIAGNOSIS — Z79899 Other long term (current) drug therapy: Secondary | ICD-10-CM | POA: Diagnosis not present

## 2017-04-04 DIAGNOSIS — M7918 Myalgia, other site: Secondary | ICD-10-CM

## 2017-04-04 DIAGNOSIS — I10 Essential (primary) hypertension: Secondary | ICD-10-CM | POA: Insufficient documentation

## 2017-04-04 DIAGNOSIS — J449 Chronic obstructive pulmonary disease, unspecified: Secondary | ICD-10-CM | POA: Insufficient documentation

## 2017-04-04 DIAGNOSIS — F17210 Nicotine dependence, cigarettes, uncomplicated: Secondary | ICD-10-CM | POA: Insufficient documentation

## 2017-04-04 DIAGNOSIS — M542 Cervicalgia: Secondary | ICD-10-CM | POA: Diagnosis not present

## 2017-04-04 MED ORDER — KETOROLAC TROMETHAMINE 10 MG PO TABS: 10.0000 mg | ORAL_TABLET | Freq: Four times a day (QID) | ORAL | 0 refills | Status: DC | PRN

## 2017-04-04 MED ORDER — KETOROLAC TROMETHAMINE 30 MG/ML IJ SOLN
30.0000 mg | Freq: Once | INTRAMUSCULAR | Status: AC
  Administered 2017-04-04: 30 mg via INTRAMUSCULAR
  Filled 2017-04-04: qty 1

## 2017-04-04 MED ORDER — CYCLOBENZAPRINE HCL 5 MG PO TABS
5.0000 mg | ORAL_TABLET | Freq: Three times a day (TID) | ORAL | 0 refills | Status: AC | PRN
Start: 2017-04-04 — End: 2017-04-11
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
## 2017-06-12 ENCOUNTER — Encounter: Payer: Self-pay | Admitting: *Deleted

## 2017-06-19 ENCOUNTER — Other Ambulatory Visit: Payer: Self-pay

## 2017-06-19 ENCOUNTER — Ambulatory Visit
Admission: RE | Admit: 2017-06-19 | Discharge: 2017-06-19 | Disposition: A | Discharge status: Home / Self Care | Payer: Medicare Other | Source: Ambulatory Visit | Attending: Ophthalmology | Admitting: Ophthalmology

## 2017-06-19 ENCOUNTER — Ambulatory Visit: Payer: Medicare Other | Admitting: Certified Registered Nurse Anesthetist

## 2017-06-19 ENCOUNTER — Encounter
Admission: RE | Disposition: A | Discharge status: Home / Self Care | Payer: Self-pay | Source: Ambulatory Visit | Attending: Ophthalmology

## 2017-06-19 DIAGNOSIS — H2511 Age-related nuclear cataract, right eye: Secondary | ICD-10-CM | POA: Diagnosis not present

## 2017-06-19 DIAGNOSIS — D67 Hereditary factor IX deficiency: Secondary | ICD-10-CM | POA: Insufficient documentation

## 2017-06-19 DIAGNOSIS — F17200 Nicotine dependence, unspecified, uncomplicated: Secondary | ICD-10-CM | POA: Insufficient documentation

## 2017-06-19 DIAGNOSIS — I10 Essential (primary) hypertension: Secondary | ICD-10-CM | POA: Diagnosis not present

## 2017-06-19 DIAGNOSIS — I456 Pre-excitation syndrome: Secondary | ICD-10-CM | POA: Diagnosis not present

## 2017-06-19 DIAGNOSIS — E7800 Pure hypercholesterolemia, unspecified: Secondary | ICD-10-CM | POA: Insufficient documentation

## 2017-06-19 DIAGNOSIS — Z923 Personal history of irradiation: Secondary | ICD-10-CM | POA: Diagnosis not present

## 2017-06-19 DIAGNOSIS — Z880 Allergy status to penicillin: Secondary | ICD-10-CM | POA: Diagnosis not present

## 2017-06-19 DIAGNOSIS — K219 Gastro-esophageal reflux disease without esophagitis: Secondary | ICD-10-CM | POA: Insufficient documentation

## 2017-06-19 DIAGNOSIS — J449 Chronic obstructive pulmonary disease, unspecified: Secondary | ICD-10-CM | POA: Insufficient documentation

## 2017-06-19 DIAGNOSIS — Z8619 Personal history of other infectious and parasitic diseases: Secondary | ICD-10-CM | POA: Insufficient documentation

## 2017-06-19 DIAGNOSIS — Z79899 Other long term (current) drug therapy: Secondary | ICD-10-CM | POA: Insufficient documentation

## 2017-06-19 DIAGNOSIS — Z8589 Personal history of malignant neoplasm of other organs and systems: Secondary | ICD-10-CM | POA: Diagnosis not present

## 2017-06-19 HISTORY — PX: CATARACT EXTRACTION W/PHACO: SHX586

## 2017-06-19 SURGERY — PHACOEMULSIFICATION, CATARACT, WITH IOL INSERTION
Anesthesia: Monitor Anesthesia Care | Site: Eye | Laterality: Right | Wound class: "Clean "

## 2017-06-19 MED ORDER — MIDAZOLAM HCL 2 MG/2ML IJ SOLN
INTRAMUSCULAR | Status: AC
  Filled 2017-06-19: qty 2

## 2017-06-19 MED ORDER — LIDOCAINE HCL (PF) 4 % IJ SOLN
INTRAMUSCULAR | Status: AC
  Filled 2017-06-19: qty 5

## 2017-06-19 MED ORDER — LIDOCAINE HCL (PF) 4 % IJ SOLN
INTRAOCULAR | Status: DC | PRN
  Administered 2017-06-19: 2 mL via OPHTHALMIC

## 2017-06-19 MED ORDER — MOXIFLOXACIN HCL 0.5 % OP SOLN: 1.0000 [drp] | OPHTHALMIC | Status: DC | PRN

## 2017-06-19 MED ORDER — NA CHONDROIT SULF-NA HYALURON 40-17 MG/ML IO SOLN
INTRAOCULAR | Status: DC | PRN
  Administered 2017-06-19: 1 mL via INTRAOCULAR

## 2017-06-19 MED ORDER — MOXIFLOXACIN HCL 0.5 % OP SOLN
OPHTHALMIC | Status: DC | PRN
  Administered 2017-06-19: .2 mL via OPHTHALMIC

## 2017-06-19 MED ORDER — MOXIFLOXACIN HCL 0.5 % OP SOLN
OPHTHALMIC | Status: AC
  Filled 2017-06-19: qty 3

## 2017-06-19 MED ORDER — NA CHONDROIT SULF-NA HYALURON 40-17 MG/ML IO SOLN
INTRAOCULAR | Status: AC
  Filled 2017-06-19: qty 1

## 2017-06-19 MED ORDER — POVIDONE-IODINE 5 % OP SOLN
OPHTHALMIC | Status: DC | PRN
  Administered 2017-06-19: 1 via OPHTHALMIC

## 2017-06-19 MED ORDER — POVIDONE-IODINE 5 % OP SOLN
OPHTHALMIC | Status: AC
  Filled 2017-06-19: qty 30

## 2017-06-19 MED ORDER — SODIUM CHLORIDE 0.9 % IV SOLN
INTRAVENOUS | Status: DC
  Administered 2017-06-19: 07:00:00 via INTRAVENOUS

## 2017-06-19 MED ORDER — MIDAZOLAM HCL 2 MG/2ML IJ SOLN
INTRAMUSCULAR | Status: DC | PRN
  Administered 2017-06-19 (×2): 1 mg via INTRAVENOUS

## 2017-06-19 MED ORDER — ARMC OPHTHALMIC DILATING DROPS
1.0000 "application " | OPHTHALMIC | Status: AC
  Administered 2017-06-19 (×3): 1 via OPHTHALMIC

## 2017-06-19 MED ORDER — EPINEPHRINE PF 1 MG/ML IJ SOLN
INTRAMUSCULAR | Status: AC
  Filled 2017-06-19: qty 1

## 2017-06-19 MED ORDER — EPINEPHRINE PF 1 MG/ML IJ SOLN
INTRAOCULAR | Status: DC | PRN
  Administered 2017-06-19: 1 mL via OPHTHALMIC

## 2017-06-19 MED ORDER — ARMC OPHTHALMIC DILATING DROPS
OPHTHALMIC | Status: AC
  Administered 2017-06-19: 1 via OPHTHALMIC
  Filled 2017-06-19: qty 0.4

## 2017-06-19 MED ORDER — CARBACHOL 0.01 % IO SOLN
INTRAOCULAR | Status: DC | PRN
Start: 2017-06-19 — End: 2017-06-19
  Administered 2017-06-19: .5 mL via INTRAOCULAR

## 2017-06-19 SURGICAL SUPPLY — 16 items

## 2017-06-19 NOTE — Discharge Instructions (Signed)
Eye Surgery Discharge Instructions  Expect mild scratchy sensation or mild soreness. DO NOT RUB YOUR EYE!  The day of surgery:  Minimal physical activity, but bed rest is not required  No reading, computer work, or close hand work  No bending, lifting, or straining.  May watch TV  For 24 hours:  No driving, legal decisions, or alcoholic beverages  Safety precautions  Eat anything you prefer: It is better to start with liquids, then soup then solid foods.  _____ Eye patch should be worn until postoperative exam tomorrow.  ____ Solar shield eyeglasses should be worn for comfort in the sunlight/patch while sleeping  Resume all regular medications including aspirin or Coumadin if these were discontinued prior to surgery. You may shower, bathe, shave, or wash your hair. Tylenol may be taken for mild discomfort.  Call your doctor if you experience significant pain, nausea, or vomiting, fever > 101 or other signs of infection. [phone] or [phone] Specific instructions:  Follow-up Information    Birder Robson, MD Follow up.   Specialty:  Ophthalmology Why:  06/19/17 at1:40 Contact information: [place] [address] [phone]        Birder Robson, MD .   Specialty:  Ophthalmology Contact information: [address] [place] [address] [phone]          Eye Surgery Discharge Instructions  Expect mild scratchy sensation or mild soreness. DO NOT RUB YOUR EYE!  The day of surgery:  Minimal physical activity, but bed rest is not required  No reading, computer work, or close hand work  No bending, lifting, or straining.  May watch TV  For 24 hours:  No driving, legal decisions, or alcoholic beverages  Safety precautions  Eat anything you prefer: It is better to start with liquids, then soup then solid foods.  _____ Eye patch should be worn until postoperative exam tomorrow.  ____ Solar shield  eyeglasses should be worn for comfort in the sunlight/patch while sleeping  Resume all regular medications including aspirin or Coumadin if these were discontinued prior to surgery. You may shower, bathe, shave, or wash your hair. Tylenol may be taken for mild discomfort.  Call your doctor if you experience significant pain, nausea, or vomiting, fever > 101 or other signs of infection. [phone] or [phone] Specific instructions:  Follow-up Information    Birder Robson, MD Follow up.   Specialty:  Ophthalmology Why:  06/19/17 at1:40 Contact information: [place] [address] [phone]        Birder Robson, MD .   Specialty:  Ophthalmology Contact information: [place] [address] [phone]

## 2017-06-19 NOTE — Anesthesia Postprocedure Evaluation (Signed)
Anesthesia Post Note  Patient: Paul Haynes  Procedure(s) Performed: CATARACT EXTRACTION PHACO AND INTRAOCULAR LENS PLACEMENT (IOC) (Right Eye)  Patient location during evaluation: Short Stay Anesthesia Type: MAC Level of consciousness: awake and alert, oriented and patient cooperative Pain management: pain level controlled Vital Signs Assessment: post-procedure vital signs reviewed and stable Respiratory status: spontaneous breathing, nonlabored ventilation and respiratory function stable Cardiovascular status: blood pressure returned to baseline Postop Assessment: no headache, no apparent nausea or vomiting and adequate PO intake Anesthetic complications: no     Last Vitals:  Vitals:   06/19/17 0708 06/19/17 0849  BP: (!) 148/72 (!) 116/53  Pulse: (!) 56 (!) 55  Resp: (!) 24 16  Temp: 36.5 C (!) 35.6 C  SpO2: 100% 99%    Last Pain:  Vitals:   06/19/17 0849  TempSrc: Temporal  PainSc: 0-No pain                 Eben Burow

## 2017-06-19 NOTE — H&P (Signed)
All labs reviewed. Abnormal studies sent to patients PCP when indicated.  Previous H&P reviewed, patient examined, there are NO CHANGES.  Paul Kaplan Porfilio6/11/20198:25 AM

## 2017-06-19 NOTE — Transfer of Care (Signed)
Immediate Anesthesia Transfer of Care Note  Patient: Paul Haynes  Procedure(s) Performed: CATARACT EXTRACTION PHACO AND INTRAOCULAR LENS PLACEMENT (IOC) (Right Eye)  Patient Location: Short Stay  Anesthesia Type:MAC  Level of Consciousness: awake, alert , oriented and patient cooperative  Airway & Oxygen Therapy: Patient Spontanous Breathing  Post-op Assessment: Report given to RN and Post -op Vital signs reviewed and stable  Post vital signs: Reviewed and stable  Last Vitals:  Vitals Value Taken Time  BP    Temp    Pulse    Resp    SpO2      Last Pain:  Vitals:   06/19/17 0708  TempSrc: Oral  PainSc: 0-No pain         Complications: No apparent anesthesia complications

## 2017-06-19 NOTE — Anesthesia Preprocedure Evaluation (Signed)
Anesthesia Evaluation  Patient identified by MRN, date of birth, ID band Patient awake    Reviewed: Allergy & Precautions, NPO status , Patient's Chart, lab work & pertinent test results  Airway Mallampati: III  TM Distance: <3 FB     Dental  (+) Upper Dentures   Pulmonary COPD, Current Smoker,    Pulmonary exam normal        Cardiovascular hypertension, Normal cardiovascular exam+ dysrhythmias      Neuro/Psych negative neurological ROS  negative psych ROS   GI/Hepatic negative GI ROS, (+) Hepatitis -, C  Endo/Other  negative endocrine ROS  Renal/GU negative Renal ROS  negative genitourinary   Musculoskeletal negative musculoskeletal ROS (+)   Abdominal Normal abdominal exam  (+)   Peds negative pediatric ROS (+)  Hematology  (+) Blood dyscrasia, ,   Anesthesia Other Findings Past Medical History: No date: Cancer ([place]) No date: COPD (chronic obstructive pulmonary disease) ([facility]) No date: 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[place]) No date: Hepatitis C No date: High cholesterol No date: Hypertension No date: WPW (Wolff-Parkinson-White syndrome)  Reproductive/Obstetrics                             Anesthesia Physical Anesthesia Plan  ASA: III  Anesthesia Plan: MAC   Post-op Pain Management:    Induction: Intravenous  PONV Risk Score and Plan:   Airway Management Planned: Nasal Cannula  Additional Equipment:   Intra-op Plan:   Post-operative Plan:   Informed Consent: I have reviewed the patients History and Physical, chart, labs and discussed the procedure including the risks, benefits and alternatives for the proposed anesthesia with the patient or authorized representative who has indicated his/her understanding and acceptance.   Dental advisory given  Plan Discussed with: Surgeon and CRNA  Anesthesia Plan Comments:         Anesthesia Quick Evaluation

## 2017-06-19 NOTE — Op Note (Signed)
PREOPERATIVE DIAGNOSIS:  Nuclear sclerotic cataract of the right eye.   POSTOPERATIVE DIAGNOSIS:  nuclear sclerotic cataract right eye   OPERATIVE PROCEDURE: Procedure(s): CATARACT EXTRACTION PHACO AND INTRAOCULAR LENS PLACEMENT (IOC)   SURGEON:  Birder Robson, MD.   ANESTHESIA:  Anesthesiologist: Alvin Critchley, MD CRNA: Eben Burow, CRNA  1.      Managed anesthesia care. 2.      0.81ml of Shugarcaine was instilled in the eye following the paracentesis.   COMPLICATIONS:  None.   TECHNIQUE:   Stop and chop   DESCRIPTION OF PROCEDURE:  The patient was examined and consented in the preoperative holding area where the aforementioned topical anesthesia was applied to the right eye and then brought back to the Operating Room where the right eye was prepped and draped in the usual sterile ophthalmic fashion and a lid speculum was placed. A paracentesis was created with the side port blade and the anterior chamber was filled with viscoelastic. A near clear corneal incision was performed with the steel keratome. A continuous curvilinear capsulorrhexis was performed with a cystotome followed by the capsulorrhexis forceps. Hydrodissection and hydrodelineation were carried out with BSS on a blunt cannula. The lens was removed in a stop and chop  technique and the remaining cortical material was removed with the irrigation-aspiration handpiece. The capsular bag was inflated with viscoelastic and the Technis [id number]  lens was placed in the capsular bag without complication. The remaining viscoelastic was removed from the eye with the irrigation-aspiration handpiece. The wounds were hydrated. The anterior chamber was flushed with Miostat and the eye was inflated to physiologic pressure. 0.49ml of Vigamox was placed in the anterior chamber. The wounds were found to be water tight. The eye was dressed with Vigamox. The patient was given protective glasses to wear throughout the day and a shield with which to  sleep tonight. The patient was also given drops with which to begin a drop regimen today and will follow-up with me in one day. Implant Name Type Inv. Item Serial No. Manufacturer Lot No. LRB No. Used  LENS IOL DIOP 21.0 - [id number] Intraocular Lens LENS IOL DIOP 21.0 [id number]  Right 1   Procedure(s) with comments: CATARACT EXTRACTION PHACO AND INTRAOCULAR LENS PLACEMENT (IOC) (Right) - [place] 00:40 AP% 17.9 CDE 7.3 Fluid pack lot 3 [id number]  Electronically signed: Birder Robson 06/19/2017 8:47 AM

## 2017-06-19 NOTE — Anesthesia Post-op Follow-up Note (Signed)
Anesthesia QCDR form completed.        

## 2017-10-03 ENCOUNTER — Other Ambulatory Visit
Admission: RE | Admit: 2017-10-03 | Discharge: 2017-10-03 | Disposition: A | Discharge status: Home / Self Care | Payer: Medicare Other | Source: Ambulatory Visit | Attending: Neurology | Admitting: Neurology

## 2017-10-03 DIAGNOSIS — R413 Other amnesia: Secondary | ICD-10-CM | POA: Insufficient documentation

## 2017-10-03 LAB — AMMONIA: Ammonia: 11 umol/L (ref 9–35)

## 2017-11-15 ENCOUNTER — Ambulatory Visit: Payer: Medicare Other | Attending: Neurology

## 2017-11-15 DIAGNOSIS — G4733 Obstructive sleep apnea (adult) (pediatric): Secondary | ICD-10-CM | POA: Insufficient documentation

## 2017-12-20 ENCOUNTER — Other Ambulatory Visit: Payer: Self-pay | Admitting: Neurology

## 2017-12-20 DIAGNOSIS — M5442 Lumbago with sciatica, left side: Principal | ICD-10-CM

## 2017-12-20 DIAGNOSIS — G8929 Other chronic pain: Secondary | ICD-10-CM

## 2018-01-10 ENCOUNTER — Ambulatory Visit
Admission: RE | Admit: 2018-01-10 | Discharge: 2018-01-10 | Disposition: A | Discharge status: Home / Self Care | Payer: Medicare Other | Source: Ambulatory Visit | Attending: Neurology | Admitting: Neurology

## 2018-01-10 DIAGNOSIS — G8929 Other chronic pain: Secondary | ICD-10-CM | POA: Diagnosis present

## 2018-01-10 DIAGNOSIS — M5442 Lumbago with sciatica, left side: Secondary | ICD-10-CM | POA: Insufficient documentation

## 2018-06-12 ENCOUNTER — Other Ambulatory Visit
Admission: RE | Admit: 2018-06-12 | Discharge: 2018-06-12 | Disposition: A | Discharge status: Home / Self Care | Payer: Medicare Other | Attending: Ophthalmology | Admitting: Ophthalmology

## 2018-06-12 ENCOUNTER — Other Ambulatory Visit: Payer: Self-pay

## 2018-06-12 DIAGNOSIS — R51 Headache: Secondary | ICD-10-CM | POA: Diagnosis present

## 2018-06-12 DIAGNOSIS — M316 Other giant cell arteritis: Secondary | ICD-10-CM | POA: Insufficient documentation

## 2018-06-12 LAB — C-REACTIVE PROTEIN: CRP: 0.8 mg/dL (ref <1.0)

## 2018-06-12 LAB — SEDIMENTATION RATE: Sed Rate: 58 mm/hr — ABNORMAL HIGH (ref 0–20)

## 2018-06-14 ENCOUNTER — Other Ambulatory Visit: Payer: Self-pay

## 2018-06-14 ENCOUNTER — Encounter (INDEPENDENT_AMBULATORY_CARE_PROVIDER_SITE_OTHER): Payer: Self-pay | Admitting: Vascular Surgery

## 2018-06-14 ENCOUNTER — Ambulatory Visit (INDEPENDENT_AMBULATORY_CARE_PROVIDER_SITE_OTHER): Payer: Medicare Other | Admitting: Vascular Surgery

## 2018-06-14 VITALS — BP 127/81 | HR 68 | Resp 16 | Ht 67.0 in | Wt 170.4 lb

## 2018-06-14 DIAGNOSIS — R51 Headache: Secondary | ICD-10-CM | POA: Diagnosis not present

## 2018-06-14 DIAGNOSIS — J42 Unspecified chronic bronchitis: Secondary | ICD-10-CM

## 2018-06-14 DIAGNOSIS — I10 Essential (primary) hypertension: Secondary | ICD-10-CM | POA: Diagnosis not present

## 2018-06-14 DIAGNOSIS — E785 Hyperlipidemia, unspecified: Secondary | ICD-10-CM | POA: Diagnosis not present

## 2018-06-14 DIAGNOSIS — F17210 Nicotine dependence, cigarettes, uncomplicated: Secondary | ICD-10-CM

## 2018-06-14 DIAGNOSIS — G8929 Other chronic pain: Secondary | ICD-10-CM | POA: Insufficient documentation

## 2018-06-14 DIAGNOSIS — R519 Headache, unspecified: Secondary | ICD-10-CM | POA: Insufficient documentation

## 2018-06-14 DIAGNOSIS — J449 Chronic obstructive pulmonary disease, unspecified: Secondary | ICD-10-CM | POA: Insufficient documentation

## 2018-06-14 DIAGNOSIS — Z79899 Other long term (current) drug therapy: Secondary | ICD-10-CM

## 2018-06-14 NOTE — Patient Instructions (Signed)
Temporal Artery Biopsy Arteries are blood vessels that carry blood from the heart to the rest of the body. Temporal arteries are found in your temples, which are on the side of the head and between the ears and eyes. Temporal artery biopsy is a procedure that removes a sample of the temporal artery. The sample is then examined under a microscope. This procedure may be done to check to see if you have a condition that causes your arteries to become swollen or inflamed (temporal arteritis or giant cell arteritis). Tell a health care provider about:  Any allergies you have.  All medicines you are taking, including vitamins, herbs, eye drops, creams, and over-the-counter medicines.  Any problems you or family members have had with anesthetic medicines.  Any blood disorders you have.  Any surgeries you have had.  Any medical conditions you have.  Whether you are pregnant or may be pregnant. What are the risks? Generally, this is a safe procedure. However, problems may occur, including:  Bleeding.  A collection of blood under the skin (hematoma).  Infection.  Nerve damage in the temples. This can cause numbness or make the muscles in your face weak.  Scarring. On the scalp, hair may not grow around the scar. What happens before the procedure?  You may have blood tests to make sure that your blood clots normally.  Ask your health care provider about: ? Changing or stopping your regular medicines. This is especially important if you are taking diabetes medicines or blood thinners. ? Taking medicines such as aspirin and ibuprofen. These medicines can thin your blood. Do not take these medicines before your procedure if your health care provider instructs you not to.  Follow instructions from your health care provider about eating or drinking restrictions.  Plan to have someone take you home after the procedure.  If you go home right after the procedure, plan to have someone with you  for 24 hours.  Ask your health care provider how your surgical site will be marked or identified.  You may be given antibiotic medicine to help prevent infection. What happens during the procedure?  To reduce your risk of infection: ? Your health care team will wash or sanitize their hands. ? Your skin will be washed with soap.  Small monitors will be put on your body. They will be used to check your heart, blood pressure, and oxygen level.  An IV tube will be inserted into one of your veins.  You will be given one or more of the following: ? A medicine to help you relax (sedative). ? A medicine to numb the area (local anesthetic).  A tool that looks like a pencil and uses sound waves (Doppler) may be used to locate the artery.  A cut (incision) will be made over the temporal artery.  Two clamps will be placed on the artery. Then, a small piece of the artery between the clamps will be cut and removed.  The remaining ends of the artery will be closed securely with stitches (sutures) to avoid bleeding. The clamps will then be removed.  The incision will be closed with sutures.  A bandage (dressing) may be placed on the incision.  The artery piece that was taken out will be checked under a microscope. The procedure may vary among health care providers and hospitals. What happens after the procedure?  Your blood pressure, heart rate, breathing rate, and blood oxygen level will be monitored often until the medicines you were given  have worn off.  You may be given medicine for pain. This information is not intended to replace advice given to you by your health care provider. Make sure you discuss any questions you have with your health care provider. Document Released: 12/14/2008 Document Revised: 08/26/2015 Document Reviewed: 06/15/2014 Elsevier Interactive Patient Education  2019 [employer].

## 2018-06-14 NOTE — Assessment & Plan Note (Signed)
Continue pulmonary medications and aerosols as already ordered, these medications have been reviewed and there are no changes at this time.   

## 2018-06-14 NOTE — Assessment & Plan Note (Signed)
blood pressure control important in reducing the progression of atherosclerotic disease. On appropriate oral medications.  

## 2018-06-14 NOTE — Assessment & Plan Note (Signed)
The patient has some right-sided headaches and visual issues with an elevated sedimentation rate and there is concern for possible giant cell arteritis.  Although there are multiple other causes in the differential diagnosis, this is one that requires biopsy to prove and can be catastrophic if undiagnosed.  I do think it is reasonable to perform a temporal artery biopsy.  I discussed how the procedure is performed.  I discussed it is generally an outpatient procedure.  I discussed the risks and benefits the procedures.  The patient voices his understanding and is agreeable to proceed with right temporal artery biopsy.

## 2018-06-14 NOTE — Assessment & Plan Note (Signed)
lipid control important in reducing the progression of atherosclerotic disease. Continue statin therapy  

## 2018-06-14 NOTE — Progress Notes (Signed)
Patient ID: Paul Haynes, male   DOB: 06-13-1955, 63 y.o.   MRN: [id number]  Chief Complaint  Patient presents with  . New Patient (Initial Visit)    ref [place] temporal artery    HPI Paul Haynes is a 63 y.o. male.  I am asked to see the patient by Dr. Neville Route for evaluation for a temporal artery biopsy.  The patient complains of pain in his right forehead and around his right eye.  This is associated with some visual changes locally.  This has been a problem now for several weeks and seems to be getting worse.  His symptoms are all on the right side at this point.  He also is having some pain in his jaw and around his gums.  He feels like there is a knot in his jaw.  No redness.  No signs of infection.  Temporal area not particularly tender to the touch.  No trauma or injury.  No clear inciting event or causative factor that started the symptoms.  He had a sedimentation rate which was elevated and his ophthalmologist recommended he have a temporal artery biopsy to evaluate for giant cell arteritis.     Past Medical History:  Diagnosis Date  . Cancer ([place])   . COPD (chronic obstructive pulmonary disease) ([place])   . 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 Hepatitis C   . High cholesterol   . Hypertension   . WPW (Wolff-Parkinson-White syndrome)     Past Surgical History:  Procedure Laterality Date  . CATARACT EXTRACTION W/PHACO Right 06/19/2017   Procedure: CATARACT EXTRACTION PHACO AND INTRAOCULAR LENS PLACEMENT (IOC);  Surgeon: Birder Robson, MD;  Location: [facility] ORS;  Service: Ophthalmology;  Laterality: Right;  [place] 00:40 AP% 17.9 CDE 7.3 Fluid pack lot 3 [id number]  . SUBDURAL HEMATOMA EVACUATION VIA CRANIOTOMY    . TONSILLECTOMY      Family History Family History  Problem Relation Age of Onset  . Heart attack Mother   . Hypertension Mother   . Hyperlipidemia Mother   . Diabetes Mother   . Heart disease Mother   . COPD Father   . Bronchitis Father     Social History Social  History   Tobacco Use  . Smoking status: Current Every Day Smoker    Packs/day: 1.00    Types: Cigarettes  . Smokeless tobacco: Never Used  Substance Use Topics  . Alcohol use: No  . Drug use: No    Allergies  Allergen Reactions  . Penicillins Anaphylaxis    Childhood allergy Has patient had a PCN reaction causing immediate rash, facial/tongue/throat swelling, SOB or lightheadedness with hypotension: Yes Has patient had a PCN reaction causing severe rash involving mucus membranes or skin necrosis: Unknown Has patient had a PCN reaction that required hospitalization: No Has patient had a PCN reaction occurring within the last 10 years: No If all of the above answers are "NO", then may proceed with Cephalosporin use.     Current Outpatient Medications  Medication Sig Dispense Refill  . albuterol-ipratropium (COMBIVENT) 18-103 MCG/ACT inhaler Inhale 1 puff into the lungs 4 (four) times daily.     Marland Kitchen amLODipine (NORVASC) 10 MG tablet Take 10 mg by mouth daily.    . Buprenorphine HCl-Naloxone HCl 8-2 MG FILM     . buPROPion (WELLBUTRIN SR) 150 MG 12 hr tablet Take 150 mg by mouth 2 (two) times daily.    . Fluticasone-Salmeterol (ADVAIR DISKUS) 100-50 MCG/DOSE AEPB Take 1 puff by mouth  2 (two) times daily.    . hydrochlorothiazide (HYDRODIURIL) 25 MG tablet Take 25 mg by mouth daily.    Marland Kitchen omeprazole (PRILOSEC) 20 MG capsule Take 20 mg by mouth daily.    . pravastatin (PRAVACHOL) 20 MG tablet Take 20 mg by mouth daily.    Marland Kitchen guaiFENesin-codeine 100-10 MG/5ML syrup Take 10 mLs by mouth every 6 (six) hours as needed for cough. (Patient not taking: Reported on 01/13/2015) 120 mL 0  . HYDROcodone-acetaminophen (NORCO) 5-325 MG tablet Take 1 tablet by mouth every 4 (four) hours as needed for moderate pain. (Patient not taking: Reported on 06/14/2017) 12 tablet 0  . ibuprofen (ADVIL,MOTRIN) 800 MG tablet Take 1 tablet (800 mg total) by mouth every 8 (eight) hours as needed. (Patient not taking:  Reported on 06/14/2017) 12 tablet 0  . ketorolac (TORADOL) 10 MG tablet Take 1 tablet (10 mg total) by mouth every 6 (six) hours as needed. (Patient not taking: Reported on 06/14/2017) 20 tablet 0  . levofloxacin (LEVAQUIN) 750 MG tablet Take 1 tablet (750 mg total) by mouth daily. (Patient not taking: Reported on 01/13/2015) 7 tablet 0  . metoprolol tartrate (LOPRESSOR) 50 MG tablet Take 50 mg by mouth 2 (two) times daily.     . naproxen (NAPROSYN) 500 MG tablet Take 1 tablet (500 mg total) by mouth 2 (two) times daily with a meal. (Patient not taking: Reported on 06/14/2017) 20 tablet 0  . ondansetron (ZOFRAN ODT) 4 MG disintegrating tablet Take 1 tablet (4 mg total) by mouth every 8 (eight) hours as needed for nausea or vomiting. (Patient not taking: Reported on 06/14/2017) 20 tablet 0  . POTASSIUM GLUCONATE PO Take 1 tablet by mouth daily.    . predniSONE (DELTASONE) 20 MG tablet Take 2 tablets (40 mg total) by mouth daily. (Patient not taking: Reported on 01/13/2015) 8 tablet 0   No current facility-administered medications for this visit.       REVIEW OF SYSTEMS (Negative unless checked)  Constitutional: [] Weight loss  [] Fever  [] Chills Cardiac: [] Chest pain   [] Chest pressure   [] Palpitations   [] Shortness of breath when laying flat   [] Shortness of breath at rest   [] Shortness of breath with exertion. Vascular:  [] Pain in legs with walking   [] Pain in legs at rest   [] Pain in legs when laying flat   [] Claudication   [] Pain in feet when walking  [] Pain in feet at rest  [] Pain in feet when laying flat   [] History of DVT   [] Phlebitis   [] Swelling in legs   [] Varicose veins   [] Non-healing ulcers Pulmonary:   [] Uses home oxygen   [] Productive cough   [] Hemoptysis   [] Wheeze  [] COPD   [] Asthma Neurologic:  [] Dizziness  [] Blackouts   [] Seizures   [] History of stroke   [] History of TIA  [] Aphasia   [x] Temporary blindness   [] Dysphagia   [] Weakness or numbness in arms   [] Weakness or numbness in legs X  positive for headaches Musculoskeletal:  [x] Arthritis   [] Joint swelling   [x] Joint pain   [] Low back pain Hematologic:  [] Easy bruising  [] Easy bleeding   [] Hypercoagulable state   [] Anemic  [] Hepatitis Gastrointestinal:  [] Blood in stool   [] Vomiting blood  [] Gastroesophageal reflux/heartburn   [] Abdominal pain Genitourinary:  [] Chronic kidney disease   [] Difficult urination  [] Frequent urination  [] Burning with urination   [] Hematuria Skin:  [] Rashes   [] Ulcers   [] Wounds Psychological:  [] History of anxiety   []  History of  major depression.    Physical Exam BP 127/81 (BP Location: Right Arm)   Pulse 68   Resp 16   Ht 5\' 7"  (1.702 m)   Wt 170 lb 6.4 oz (77.3 kg)   BMI 26.69 kg/m  Gen:  WD/WN, NAD. Appears older than stated age. Head: [place]/AT, No temporalis wasting. Prominent temp pulse not noted. Not particularly tender in the right temporal region Ear/Nose/Throat: Hearing grossly intact, nares w/o erythema or drainage, oropharynx w/o Erythema/Exudate Eyes: Conjunctiva clear, sclera non-icteric  Neck: trachea midline.  No JVD. No bruit. Pulmonary:  Good air movement, respirations not labored, no use of accessory muscles  Cardiac: RRR, no JVD Vascular:  Vessel Right Left  Radial Palpable Palpable                                   Gastrointestinal:. No masses, surgical incisions, or scars. Musculoskeletal: M/S 5/5 throughout.  Extremities without ischemic changes.  No deformity or atrophy. No edema. Neurologic: Sensation grossly intact in extremities.  Symmetrical.  Speech is fluent. Motor exam as listed above. Psychiatric: Judgment intact, Mood & affect appropriate for pt's clinical situation. Dermatologic: No rashes or ulcers noted.  No cellulitis or open wounds.    Radiology No results found.  Labs Recent Results (from the past 2160 hour(s))  C-reactive protein     Status: None   Collection Time: 06/12/18  1:11 PM  Result Value Ref Range   CRP 0.8 <1.0 mg/dL     Comment: Performed at [facility] [address], [place], [place] [address]  Sedimentation rate     Status: Abnormal   Collection Time: 06/12/18  1:11 PM  Result Value Ref Range   Sed Rate 58 (H) 0 - 20 mm/hr    Comment: Performed at [facility], [address] [place], [place] [address]    Assessment/Plan:  COPD (chronic obstructive pulmonary disease) ([place]) Continue pulmonary medications and aerosols as already ordered, these medications have been reviewed and there are no changes at this time.    Essential hypertension blood pressure control important in reducing the progression of atherosclerotic disease. On appropriate oral medications.   Hyperlipidemia lipid control important in reducing the progression of atherosclerotic disease. Continue statin therapy   Chronic right-sided headaches The patient has some right-sided headaches and visual issues with an elevated sedimentation rate and there is concern for possible giant cell arteritis.  Although there are multiple other causes in the differential diagnosis, this is one that requires biopsy to prove and can be catastrophic if undiagnosed.  I do think it is reasonable to perform a temporal artery biopsy.  I discussed how the procedure is performed.  I discussed it is generally an outpatient procedure.  I discussed the risks and benefits the procedures.  The patient voices his understanding and is agreeable to proceed with right temporal artery biopsy.      Leotis Pain 06/14/2018, 4:01 PM   This note was created with Dragon medical transcription system.  Any errors from dictation are unintentional.

## 2018-06-17 ENCOUNTER — Other Ambulatory Visit (INDEPENDENT_AMBULATORY_CARE_PROVIDER_SITE_OTHER): Payer: Self-pay | Admitting: Nurse Practitioner

## 2018-06-17 ENCOUNTER — Encounter (INDEPENDENT_AMBULATORY_CARE_PROVIDER_SITE_OTHER): Payer: Self-pay

## 2018-06-17 ENCOUNTER — Telehealth (INDEPENDENT_AMBULATORY_CARE_PROVIDER_SITE_OTHER): Payer: Self-pay

## 2018-06-17 NOTE — Telephone Encounter (Signed)
I attempted to contact the patient twice before he called back. Patient is scheduled to have a right temporal biopsy 06/19/2018 with Dr. Lucky Cowboy his Covid testing and pre-op is scheduled for 06/18/2018 @ 7:30 at the [facility]. Zero visitor and being NPO was discussed with the patient. Patient understood.

## 2018-06-18 ENCOUNTER — Other Ambulatory Visit
Admission: RE | Admit: 2018-06-18 | Discharge: 2018-06-18 | Disposition: A | Discharge status: Home / Self Care | Payer: Medicare Other | Source: Ambulatory Visit | Attending: Vascular Surgery | Admitting: Vascular Surgery

## 2018-06-18 ENCOUNTER — Other Ambulatory Visit: Payer: Self-pay

## 2018-06-18 DIAGNOSIS — Z1159 Encounter for screening for other viral diseases: Secondary | ICD-10-CM | POA: Diagnosis not present

## 2018-06-18 DIAGNOSIS — Z01812 Encounter for preprocedural laboratory examination: Secondary | ICD-10-CM | POA: Diagnosis present

## 2018-06-18 LAB — SARS CORONAVIRUS 2 BY RT PCR (HOSPITAL ORDER, PERFORMED IN ~~LOC~~ HOSPITAL LAB): SARS Coronavirus 2: NEGATIVE

## 2018-06-18 MED ORDER — CLINDAMYCIN PHOSPHATE 300 MG/50ML IV SOLN: 300.0000 mg | INTRAVENOUS | Status: DC

## 2018-06-18 NOTE — Telephone Encounter (Signed)
Patient missed his pre-op and Covid testing this morning, when I called his friend stated he was confused. Patient will go today before 3 pm to have his test taken. Patient's friend stated he will make sure he gets there.

## 2018-06-19 ENCOUNTER — Ambulatory Visit
Admission: RE | Admit: 2018-06-19 | Discharge: 2018-06-19 | Disposition: A | Discharge status: Home / Self Care | Payer: Medicare Other | Attending: Vascular Surgery | Admitting: Vascular Surgery

## 2018-06-19 ENCOUNTER — Ambulatory Visit: Payer: Medicare Other | Admitting: Anesthesiology

## 2018-06-19 ENCOUNTER — Other Ambulatory Visit: Payer: Self-pay

## 2018-06-19 ENCOUNTER — Encounter
Admission: RE | Disposition: A | Discharge status: Home / Self Care | Payer: Self-pay | Source: Home / Self Care | Attending: Vascular Surgery

## 2018-06-19 DIAGNOSIS — D66 Hereditary factor VIII deficiency: Secondary | ICD-10-CM | POA: Diagnosis not present

## 2018-06-19 DIAGNOSIS — Z8619 Personal history of other infectious and parasitic diseases: Secondary | ICD-10-CM | POA: Diagnosis not present

## 2018-06-19 DIAGNOSIS — Z5309 Procedure and treatment not carried out because of other contraindication: Secondary | ICD-10-CM | POA: Diagnosis not present

## 2018-06-19 DIAGNOSIS — Z9841 Cataract extraction status, right eye: Secondary | ICD-10-CM | POA: Insufficient documentation

## 2018-06-19 DIAGNOSIS — I456 Pre-excitation syndrome: Secondary | ICD-10-CM | POA: Diagnosis not present

## 2018-06-19 DIAGNOSIS — F17200 Nicotine dependence, unspecified, uncomplicated: Secondary | ICD-10-CM | POA: Diagnosis not present

## 2018-06-19 DIAGNOSIS — J449 Chronic obstructive pulmonary disease, unspecified: Secondary | ICD-10-CM | POA: Diagnosis not present

## 2018-06-19 DIAGNOSIS — I10 Essential (primary) hypertension: Secondary | ICD-10-CM | POA: Diagnosis not present

## 2018-06-19 DIAGNOSIS — E7800 Pure hypercholesterolemia, unspecified: Secondary | ICD-10-CM | POA: Insufficient documentation

## 2018-06-19 DIAGNOSIS — Z01810 Encounter for preprocedural cardiovascular examination: Secondary | ICD-10-CM

## 2018-06-19 LAB — BASIC METABOLIC PANEL
Anion gap: 10 (ref 5–15)
BUN: 12 mg/dL (ref 8–23)
CO2: 31 mmol/L (ref 22–32)
Calcium: 8.9 mg/dL (ref 8.9–10.3)
Chloride: 89 mmol/L — ABNORMAL LOW (ref 98–111)
Creatinine, Ser: 1.01 mg/dL (ref 0.61–1.24)
GFR calc Af Amer: >60 mL/min (ref >60)
GFR calc non Af Amer: >60 mL/min (ref >60)
Glucose, Bld: 109 mg/dL — ABNORMAL HIGH (ref 70–99)
Potassium: 2.6 mmol/L — CL (ref 3.5–5.1)
Sodium: 130 mmol/L — ABNORMAL LOW (ref 135–145)

## 2018-06-19 LAB — CBC WITH DIFFERENTIAL/PLATELET
Abs Immature Granulocytes: 0.02 10*3/uL (ref 0.00–0.07)
Basophils Absolute: 0 10*3/uL (ref 0.0–0.1)
Basophils Relative: 1 %
Eosinophils Absolute: 0.2 10*3/uL (ref 0.0–0.5)
Eosinophils Relative: 3 %
HCT: 38.2 % — ABNORMAL LOW (ref 39.0–52.0)
Hemoglobin: 13.5 g/dL (ref 13.0–17.0)
Immature Granulocytes: 0 %
Lymphocytes Relative: 30 %
Lymphs Abs: 2 10*3/uL (ref 0.7–4.0)
MCH: 29 pg (ref 26.0–34.0)
MCHC: 35.3 g/dL (ref 30.0–36.0)
MCV: 82.2 fL (ref 80.0–100.0)
Monocytes Absolute: 0.6 10*3/uL (ref 0.1–1.0)
Monocytes Relative: 10 %
Neutro Abs: 3.6 10*3/uL (ref 1.7–7.7)
Neutrophils Relative %: 56 %
Platelets: 230 10*3/uL (ref 150–400)
RBC: 4.65 MIL/uL (ref 4.22–5.81)
RDW: 13.4 % (ref 11.5–15.5)
WBC: 6.4 10*3/uL (ref 4.0–10.5)
nRBC: 0 % (ref 0.0–0.2)

## 2018-06-19 LAB — TYPE AND SCREEN
ABO/RH(D): A POS
Antibody Screen: NEGATIVE

## 2018-06-19 LAB — PROTIME-INR
INR: 1 (ref 0.8–1.2)
Prothrombin Time: 13.4 seconds (ref 11.4–15.2)

## 2018-06-19 LAB — APTT: aPTT: 57 seconds — ABNORMAL HIGH (ref 24–36)

## 2018-06-19 SURGERY — BIOPSY TEMPORAL ARTERY
Anesthesia: General | Laterality: Right

## 2018-06-19 MED ORDER — CHLORHEXIDINE GLUCONATE CLOTH 2 % EX PADS: 6.0000 | MEDICATED_PAD | Freq: Once | CUTANEOUS | Status: DC

## 2018-06-19 MED ORDER — LIDOCAINE HCL (PF) 2 % IJ SOLN
INTRAMUSCULAR | Status: AC
  Filled 2018-06-19: qty 10

## 2018-06-19 MED ORDER — SUCCINYLCHOLINE CHLORIDE 20 MG/ML IJ SOLN
INTRAMUSCULAR | Status: AC
  Filled 2018-06-19: qty 1

## 2018-06-19 MED ORDER — LACTATED RINGERS IV SOLN
Freq: Once | INTRAVENOUS | Status: AC
  Administered 2018-06-19: 09:00:00 via INTRAVENOUS

## 2018-06-19 MED ORDER — ROCURONIUM BROMIDE 50 MG/5ML IV SOLN
INTRAVENOUS | Status: AC
  Filled 2018-06-19: qty 1

## 2018-06-19 MED ORDER — CLINDAMYCIN PHOSPHATE 300 MG/50ML IV SOLN
INTRAVENOUS | Status: AC
  Filled 2018-06-19: qty 50

## 2018-06-19 MED ORDER — FENTANYL CITRATE (PF) 100 MCG/2ML IJ SOLN
INTRAMUSCULAR | Status: AC
  Filled 2018-06-19: qty 2

## 2018-06-19 MED ORDER — MIDAZOLAM HCL 2 MG/2ML IJ SOLN
INTRAMUSCULAR | Status: AC
  Filled 2018-06-19: qty 2

## 2018-06-19 MED ORDER — PROPOFOL 10 MG/ML IV BOLUS
INTRAVENOUS | Status: AC
  Filled 2018-06-19: qty 20

## 2018-06-19 SURGICAL SUPPLY — 40 items
BLADE SURG 15 STRL LF DISP TIS (BLADE) ×1 IMPLANT
BLADE SURG 15 STRL SS (BLADE) ×2
BLADE SURG SZ11 CARB STEEL (BLADE) ×3 IMPLANT
CNTNR SPEC 2.5X3XGRAD LEK (MISCELLANEOUS)
CONT SPEC 4OZ STER OR WHT (MISCELLANEOUS)
CONTAINER SPEC 2.5X3XGRAD LEK (MISCELLANEOUS) IMPLANT
COTTON BALL STRL MEDIUM (GAUZE/BANDAGES/DRESSINGS) ×3 IMPLANT
COVER WAND RF STERILE (DRAPES) ×3 IMPLANT
DERMABOND ADVANCED (GAUZE/BANDAGES/DRESSINGS) ×2
DERMABOND ADVANCED .7 DNX12 (GAUZE/BANDAGES/DRESSINGS) ×1 IMPLANT
DRAPE LAPAROTOMY 77X122 PED (DRAPES) ×3 IMPLANT
DRSG TELFA 4X3 1S NADH ST (GAUZE/BANDAGES/DRESSINGS) ×3 IMPLANT
ELECT CAUTERY BLADE 6.4 (BLADE) ×3 IMPLANT
ELECT REM PT RETURN 9FT ADLT (ELECTROSURGICAL) ×3
ELECTRODE REM PT RTRN 9FT ADLT (ELECTROSURGICAL) ×1 IMPLANT
GLOVE BIO SURGEON STRL SZ7 (GLOVE) ×3 IMPLANT
GOWN STRL REUS W/ TWL LRG LVL3 (GOWN DISPOSABLE) ×2 IMPLANT
GOWN STRL REUS W/ TWL XL LVL3 (GOWN DISPOSABLE) ×1 IMPLANT
GOWN STRL REUS W/TWL LRG LVL3 (GOWN DISPOSABLE) ×4
GOWN STRL REUS W/TWL XL LVL3 (GOWN DISPOSABLE) ×2
KIT TURNOVER KIT A (KITS) ×3 IMPLANT
LABEL OR SOLS (LABEL) ×3 IMPLANT
NEEDLE HYPO 25X1 1.5 SAFETY (NEEDLE) IMPLANT
NS IRRIG 500ML POUR BTL (IV SOLUTION) ×3 IMPLANT
PACK BASIN MINOR ARMC (MISCELLANEOUS) ×3 IMPLANT
SOL PREP PVP 2OZ (MISCELLANEOUS) ×3
SOLUTION PREP PVP 2OZ (MISCELLANEOUS) ×1 IMPLANT
SUCTION FRAZIER HANDLE 10FR (MISCELLANEOUS) ×2
SUCTION TUBE FRAZIER 10FR DISP (MISCELLANEOUS) ×1 IMPLANT
SUT MNCRL AB 4-0 PS2 18 (SUTURE) ×3 IMPLANT
SUT SILK 2 0 (SUTURE) ×2
SUT SILK 2-0 18XBRD TIE 12 (SUTURE) ×1 IMPLANT
SUT SILK 3 0 (SUTURE) ×2
SUT SILK 3-0 18XBRD TIE 12 (SUTURE) ×1 IMPLANT
SUT SILK 4 0 (SUTURE) ×2
SUT SILK 4-0 18XBRD TIE 12 (SUTURE) ×1 IMPLANT
SUT VIC AB 3-0 SH 27 (SUTURE) ×2
SUT VIC AB 3-0 SH 27X BRD (SUTURE) ×1 IMPLANT
SYR 10ML LL (SYRINGE) IMPLANT
SYR BULB IRRIG 60ML STRL (SYRINGE) ×3 IMPLANT

## 2018-06-19 NOTE — H&P (Signed)
[facility] History & Physical Update  The patient was interviewed and re-examined.  The patient's previous History and Physical has been reviewed and is unchanged.  There is no change in the plan of care. We plan to proceed with the scheduled procedure.  Leotis Pain, MD  06/19/2018, 9:25 AM

## 2018-06-19 NOTE — Anesthesia Preprocedure Evaluation (Signed)
Anesthesia Evaluation  Patient identified by MRN, date of birth, ID band Patient awake    Reviewed: Allergy & Precautions, H&P , NPO status , Patient's Chart, lab work & pertinent test results  History of Anesthesia Complications Negative for: history of anesthetic complications  Airway Mallampati: II  TM Distance: >3 FB Neck ROM: full    Dental  (+) Poor Dentition, Edentulous Lower, Edentulous Upper   Pulmonary COPD, Current Smoker,           Cardiovascular Exercise Tolerance: Good hypertension, (-) angina(-) Past MI and (-) DOE      Neuro/Psych negative neurological ROS  negative psych ROS   GI/Hepatic negative GI ROS, (+) Hepatitis -, C  Endo/Other  negative endocrine ROS  Renal/GU negative Renal ROS  negative genitourinary   Musculoskeletal   Abdominal   Peds  Hematology negative hematology ROS (+)   Anesthesia Other Findings Past Medical History: No date: Cancer ([place]) No date: COPD (chronic obstructive pulmonary disease) ([facility]) No date: 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[place]) No date: Hepatitis C No date: High cholesterol No date: Hypertension No date: WPW (Wolff-Parkinson-White syndrome)  Past Surgical History: 06/19/2017: CATARACT EXTRACTION W/PHACO; Right     Comment:  Procedure: CATARACT EXTRACTION PHACO AND INTRAOCULAR               LENS PLACEMENT (IOC);  Surgeon: Birder Robson, MD;                Location: [facility] ORS;  Service: Ophthalmology;  Laterality:              Right;  [place] 00:40 AP% 17.9 CDE 7.3 Fluid pack lot 3               [id number] H No date: SUBDURAL HEMATOMA EVACUATION VIA CRANIOTOMY No date: TONSILLECTOMY  BMI    Body Mass Index:  26.63 kg/m      Reproductive/Obstetrics negative OB ROS                             Anesthesia Physical Anesthesia Plan  ASA: III  Anesthesia Plan: General   Post-op Pain Management:    Induction: Intravenous  PONV Risk Score and Plan:  Propofol infusion and TIVA  Airway Management Planned: Natural Airway and Nasal Cannula  Additional Equipment:   Intra-op Plan:   Post-operative Plan:   Informed Consent: I have reviewed the patients History and Physical, chart, labs and discussed the procedure including the risks, benefits and alternatives for the proposed anesthesia with the patient or authorized representative who has indicated his/her understanding and acceptance.     Dental Advisory Given  Plan Discussed with: Anesthesiologist, CRNA and Surgeon  Anesthesia Plan Comments: (Patient consented for risks of anesthesia including but not limited to:  - adverse reactions to medications - risk of intubation if required - damage to teeth, lips or other oral mucosa - sore throat or hoarseness - Damage to heart, brain, lungs or loss of life  Patient voiced understanding.)        Anesthesia Quick Evaluation

## 2018-06-19 NOTE — Progress Notes (Signed)
Received call from lab. Potassium 2.6. Spoke with Dr. Amie Critchley and Dr. Lucky Cowboy.  Per Dr. Lucky Cowboy, will need to reschedule today's surgery. Dr. Amie Critchley notified patient. Son-in-law, Dabid Godown, will pick up patient.

## 2018-06-21 ENCOUNTER — Other Ambulatory Visit (INDEPENDENT_AMBULATORY_CARE_PROVIDER_SITE_OTHER): Payer: Self-pay

## 2018-06-21 ENCOUNTER — Other Ambulatory Visit (INDEPENDENT_AMBULATORY_CARE_PROVIDER_SITE_OTHER): Payer: Self-pay | Admitting: Nurse Practitioner

## 2018-06-21 NOTE — Telephone Encounter (Signed)
Patient has been rescheduled with Dr. Lucky Cowboy for 06/26/2018 and per pre-admission he does not have to retake a Covid test as long as he self-quarantines and does not develop symptoms. This information was given to the patient.

## 2018-06-25 ENCOUNTER — Encounter: Payer: Self-pay | Admitting: Anesthesiology

## 2018-06-25 MED ORDER — CLINDAMYCIN PHOSPHATE 300 MG/50ML IV SOLN: 300.0000 mg | INTRAVENOUS | Status: AC

## 2018-06-26 ENCOUNTER — Other Ambulatory Visit
Admission: RE | Admit: 2018-06-26 | Discharge: 2018-06-26 | Disposition: A | Discharge status: Home / Self Care | Payer: Medicare Other | Source: Ambulatory Visit | Attending: Vascular Surgery | Admitting: Vascular Surgery

## 2018-06-26 ENCOUNTER — Other Ambulatory Visit: Payer: Self-pay

## 2018-06-26 DIAGNOSIS — Z1159 Encounter for screening for other viral diseases: Secondary | ICD-10-CM | POA: Diagnosis present

## 2018-06-27 LAB — NOVEL CORONAVIRUS, NAA (HOSP ORDER, SEND-OUT TO REF LAB; TAT 18-24 HRS): SARS-CoV-2, NAA: NOT DETECTED

## 2018-06-28 ENCOUNTER — Ambulatory Visit: Payer: Medicare Other | Admitting: Anesthesiology

## 2018-06-28 ENCOUNTER — Encounter
Admission: RE | Disposition: A | Discharge status: Home / Self Care | Payer: Self-pay | Source: Home / Self Care | Attending: Vascular Surgery

## 2018-06-28 ENCOUNTER — Encounter: Payer: Self-pay | Admitting: Emergency Medicine

## 2018-06-28 ENCOUNTER — Ambulatory Visit
Admission: RE | Admit: 2018-06-28 | Discharge: 2018-06-28 | Disposition: A | Discharge status: Home / Self Care | Payer: Medicare Other | Attending: Vascular Surgery | Admitting: Vascular Surgery

## 2018-06-28 DIAGNOSIS — I10 Essential (primary) hypertension: Secondary | ICD-10-CM | POA: Insufficient documentation

## 2018-06-28 DIAGNOSIS — J449 Chronic obstructive pulmonary disease, unspecified: Secondary | ICD-10-CM | POA: Insufficient documentation

## 2018-06-28 DIAGNOSIS — E785 Hyperlipidemia, unspecified: Secondary | ICD-10-CM | POA: Insufficient documentation

## 2018-06-28 DIAGNOSIS — Z79899 Other long term (current) drug therapy: Secondary | ICD-10-CM | POA: Diagnosis not present

## 2018-06-28 DIAGNOSIS — I7789 Other specified disorders of arteries and arterioles: Secondary | ICD-10-CM | POA: Diagnosis not present

## 2018-06-28 DIAGNOSIS — Z7951 Long term (current) use of inhaled steroids: Secondary | ICD-10-CM | POA: Diagnosis not present

## 2018-06-28 DIAGNOSIS — F17210 Nicotine dependence, cigarettes, uncomplicated: Secondary | ICD-10-CM | POA: Insufficient documentation

## 2018-06-28 DIAGNOSIS — R51 Headache: Secondary | ICD-10-CM | POA: Diagnosis not present

## 2018-06-28 DIAGNOSIS — H539 Unspecified visual disturbance: Secondary | ICD-10-CM | POA: Diagnosis not present

## 2018-06-28 HISTORY — PX: ARTERY BIOPSY: SHX891

## 2018-06-28 LAB — CBC WITH DIFFERENTIAL/PLATELET
Abs Immature Granulocytes: 0.03 10*3/uL (ref 0.00–0.07)
Basophils Absolute: 0.1 10*3/uL (ref 0.0–0.1)
Basophils Relative: 1 %
Eosinophils Absolute: 0.3 10*3/uL (ref 0.0–0.5)
Eosinophils Relative: 4 %
HCT: 38.3 % — ABNORMAL LOW (ref 39.0–52.0)
Hemoglobin: 13.5 g/dL (ref 13.0–17.0)
Immature Granulocytes: 0 %
Lymphocytes Relative: 24 %
Lymphs Abs: 1.9 10*3/uL (ref 0.7–4.0)
MCH: 29.5 pg (ref 26.0–34.0)
MCHC: 35.2 g/dL (ref 30.0–36.0)
MCV: 83.6 fL (ref 80.0–100.0)
Monocytes Absolute: 0.8 10*3/uL (ref 0.1–1.0)
Monocytes Relative: 10 %
Neutro Abs: 5 10*3/uL (ref 1.7–7.7)
Neutrophils Relative %: 61 %
Platelets: 220 10*3/uL (ref 150–400)
RBC: 4.58 MIL/uL (ref 4.22–5.81)
RDW: 13.5 % (ref 11.5–15.5)
WBC: 8.1 10*3/uL (ref 4.0–10.5)
nRBC: 0 % (ref 0.0–0.2)

## 2018-06-28 LAB — BASIC METABOLIC PANEL
Anion gap: 12 (ref 5–15)
BUN: 14 mg/dL (ref 8–23)
CO2: 31 mmol/L (ref 22–32)
Calcium: 9 mg/dL (ref 8.9–10.3)
Chloride: 88 mmol/L — ABNORMAL LOW (ref 98–111)
Creatinine, Ser: 1.24 mg/dL (ref 0.61–1.24)
GFR calc Af Amer: >60 mL/min (ref >60)
GFR calc non Af Amer: >60 mL/min (ref >60)
Glucose, Bld: 103 mg/dL — ABNORMAL HIGH (ref 70–99)
Potassium: 3.1 mmol/L — ABNORMAL LOW (ref 3.5–5.1)
Sodium: 131 mmol/L — ABNORMAL LOW (ref 135–145)

## 2018-06-28 SURGERY — BIOPSY TEMPORAL ARTERY
Anesthesia: General | Laterality: Right

## 2018-06-28 MED ORDER — ONDANSETRON HCL 4 MG/2ML IJ SOLN
INTRAMUSCULAR | Status: AC
  Filled 2018-06-28: qty 2

## 2018-06-28 MED ORDER — MIDAZOLAM HCL 2 MG/2ML IJ SOLN
INTRAMUSCULAR | Status: AC
  Filled 2018-06-28: qty 2

## 2018-06-28 MED ORDER — LIDOCAINE HCL (PF) 2 % IJ SOLN
INTRAMUSCULAR | Status: AC
  Filled 2018-06-28: qty 10

## 2018-06-28 MED ORDER — ONDANSETRON HCL 4 MG/2ML IJ SOLN: 4.0000 mg | Freq: Once | INTRAMUSCULAR | Status: DC | PRN

## 2018-06-28 MED ORDER — PROPOFOL 10 MG/ML IV BOLUS
INTRAVENOUS | Status: DC | PRN
  Administered 2018-06-28: 27 mg via INTRAVENOUS

## 2018-06-28 MED ORDER — BUPIVACAINE HCL (PF) 0.5 % IJ SOLN
INTRAMUSCULAR | Status: DC | PRN
  Administered 2018-06-28: 4 mL

## 2018-06-28 MED ORDER — FENTANYL CITRATE (PF) 100 MCG/2ML IJ SOLN
INTRAMUSCULAR | Status: AC
  Filled 2018-06-28: qty 2

## 2018-06-28 MED ORDER — PROPOFOL 500 MG/50ML IV EMUL
INTRAVENOUS | Status: DC | PRN
  Administered 2018-06-28: 75 ug/kg/min via INTRAVENOUS

## 2018-06-28 MED ORDER — CHLORHEXIDINE GLUCONATE CLOTH 2 % EX PADS: 6.0000 | MEDICATED_PAD | Freq: Once | CUTANEOUS | Status: DC

## 2018-06-28 MED ORDER — LIDOCAINE HCL (CARDIAC) PF 100 MG/5ML IV SOSY
PREFILLED_SYRINGE | INTRAVENOUS | Status: DC | PRN
  Administered 2018-06-28: 60 mg via INTRAVENOUS

## 2018-06-28 MED ORDER — FAMOTIDINE 20 MG PO TABS
20.0000 mg | ORAL_TABLET | Freq: Once | ORAL | Status: AC
  Administered 2018-06-28: 20 mg via ORAL

## 2018-06-28 MED ORDER — TRAMADOL HCL 50 MG PO TABS: 50.0000 mg | ORAL_TABLET | Freq: Four times a day (QID) | ORAL | 0 refills | Status: DC | PRN

## 2018-06-28 MED ORDER — CLINDAMYCIN PHOSPHATE 300 MG/50ML IV SOLN
INTRAVENOUS | Status: AC
  Filled 2018-06-28: qty 50

## 2018-06-28 MED ORDER — CLINDAMYCIN PHOSPHATE 300 MG/50ML IV SOLN
INTRAVENOUS | Status: DC | PRN
  Administered 2018-06-28: 300 mg via INTRAVENOUS

## 2018-06-28 MED ORDER — LIDOCAINE-EPINEPHRINE 1 %-1:100000 IJ SOLN
INTRAMUSCULAR | Status: DC | PRN
  Administered 2018-06-28: 5 mL

## 2018-06-28 MED ORDER — LACTATED RINGERS IV SOLN
INTRAVENOUS | Status: DC
  Administered 2018-06-28: 09:00:00 via INTRAVENOUS

## 2018-06-28 MED ORDER — FENTANYL CITRATE (PF) 100 MCG/2ML IJ SOLN: 25.0000 ug | INTRAMUSCULAR | Status: DC | PRN

## 2018-06-28 MED ORDER — MIDAZOLAM HCL 2 MG/2ML IJ SOLN
INTRAMUSCULAR | Status: DC | PRN
  Administered 2018-06-28: 2 mg via INTRAVENOUS

## 2018-06-28 MED ORDER — FENTANYL CITRATE (PF) 100 MCG/2ML IJ SOLN
INTRAMUSCULAR | Status: DC | PRN
  Administered 2018-06-28 (×2): 25 ug via INTRAVENOUS

## 2018-06-28 MED ORDER — DEXAMETHASONE SODIUM PHOSPHATE 10 MG/ML IJ SOLN
INTRAMUSCULAR | Status: AC
  Filled 2018-06-28: qty 1

## 2018-06-28 MED ORDER — FAMOTIDINE 20 MG PO TABS
ORAL_TABLET | ORAL | Status: AC
  Filled 2018-06-28: qty 1

## 2018-06-28 MED ORDER — PROPOFOL 10 MG/ML IV BOLUS
INTRAVENOUS | Status: AC
  Filled 2018-06-28: qty 20

## 2018-06-28 MED ORDER — PROPOFOL 500 MG/50ML IV EMUL
INTRAVENOUS | Status: AC
  Filled 2018-06-28: qty 50

## 2018-06-28 SURGICAL SUPPLY — 35 items
BLADE CLIPPER SURG (BLADE) ×3 IMPLANT
BLADE SURG 15 STRL LF DISP TIS (BLADE) ×1 IMPLANT
BLADE SURG 15 STRL SS (BLADE) ×2
COTTON BALL STRL MEDIUM (GAUZE/BANDAGES/DRESSINGS) ×3 IMPLANT
COVER WAND RF STERILE (DRAPES) ×3 IMPLANT
DERMABOND ADVANCED (GAUZE/BANDAGES/DRESSINGS) ×2
DERMABOND ADVANCED .7 DNX12 (GAUZE/BANDAGES/DRESSINGS) ×1 IMPLANT
DRAPE LAPAROTOMY 77X122 PED (DRAPES) ×3 IMPLANT
DRSG TELFA 4X3 1S NADH ST (GAUZE/BANDAGES/DRESSINGS) ×3 IMPLANT
ELECT CAUTERY BLADE 6.4 (BLADE) ×3 IMPLANT
ELECT REM PT RETURN 9FT ADLT (ELECTROSURGICAL) ×3
ELECTRODE REM PT RTRN 9FT ADLT (ELECTROSURGICAL) ×1 IMPLANT
GLOVE BIO SURGEON STRL SZ7 (GLOVE) ×3 IMPLANT
GLOVE INDICATOR 7.5 STRL GRN (GLOVE) ×3 IMPLANT
GLOVE SURG SYN 8.0 (GLOVE) ×3 IMPLANT
GOWN L4 XLG 20 PK N/S (GOWN DISPOSABLE) ×3 IMPLANT
GOWN STRL REUS W/ TWL LRG LVL3 (GOWN DISPOSABLE) ×2 IMPLANT
GOWN STRL REUS W/TWL LRG LVL3 (GOWN DISPOSABLE) ×4
LABEL OR SOLS (LABEL) ×3 IMPLANT
NEEDLE HYPO 25X1 1.5 SAFETY (NEEDLE) ×6 IMPLANT
NS IRRIG 500ML POUR BTL (IV SOLUTION) ×3 IMPLANT
PACK BASIN MINOR ARMC (MISCELLANEOUS) ×3 IMPLANT
SOL PREP PVP 2OZ (MISCELLANEOUS) ×3
SOLUTION PREP PVP 2OZ (MISCELLANEOUS) ×1 IMPLANT
SUCTION FRAZIER HANDLE 10FR (MISCELLANEOUS) ×2
SUCTION TUBE FRAZIER 10FR DISP (MISCELLANEOUS) ×1 IMPLANT
SUT MNCRL AB 4-0 PS2 18 (SUTURE) ×3 IMPLANT
SUT SILK 3 0 (SUTURE) ×2
SUT SILK 3-0 18XBRD TIE 12 (SUTURE) ×1 IMPLANT
SUT SILK 4 0 (SUTURE) ×2
SUT SILK 4-0 18XBRD TIE 12 (SUTURE) ×1 IMPLANT
SUT VIC AB 3-0 SH 27 (SUTURE) ×2
SUT VIC AB 3-0 SH 27X BRD (SUTURE) ×1 IMPLANT
SYR 10ML LL (SYRINGE) ×6 IMPLANT
SYR BULB IRRIG 60ML STRL (SYRINGE) ×3 IMPLANT

## 2018-06-28 NOTE — Anesthesia Preprocedure Evaluation (Addendum)
Anesthesia Evaluation  Patient identified by MRN, date of birth, ID band Patient awake    Reviewed: Allergy & Precautions, H&P , NPO status , Patient's Chart, lab work & pertinent test results  History of Anesthesia Complications Negative for: history of anesthetic complications  Airway Mallampati: II  TM Distance: >3 FB Neck ROM: full    Dental  (+) Poor Dentition, Edentulous Lower, Edentulous Upper   Pulmonary COPD, Current Smoker,           Cardiovascular Exercise Tolerance: Good hypertension, (-) angina(-) Past MI and (-) DOE      Neuro/Psych negative neurological ROS  negative psych ROS   GI/Hepatic negative GI ROS, (+) Hepatitis -, C  Endo/Other  negative endocrine ROS  Renal/GU negative Renal ROS  negative genitourinary   Musculoskeletal   Abdominal   Peds  Hematology negative hematology ROS (+)   Anesthesia Other Findings Past Medical History: No date: Cancer ([place]) No date: COPD (chronic obstructive pulmonary disease) ([facility]) No date: 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63 kg/m      Reproductive/Obstetrics negative OB ROS                             Anesthesia Physical  Anesthesia Plan  ASA: III  Anesthesia Plan: General   Post-op Pain Management:    Induction: Intravenous  PONV Risk Score and Plan:  Propofol infusion and TIVA  Airway Management Planned: Natural Airway and Nasal Cannula  Additional Equipment:   Intra-op Plan:   Post-operative Plan:   Informed Consent: I have reviewed the patients History and Physical, chart, labs and discussed the procedure including the risks, benefits and alternatives for the proposed anesthesia with the patient or authorized representative who has indicated his/her understanding and acceptance.     Dental Advisory Given  Plan Discussed with: Anesthesiologist, CRNA and Surgeon  Anesthesia Plan Comments: (Patient consented for risks of anesthesia including but not limited to:  - adverse reactions to medications - risk of intubation if required - damage to teeth, lips or other oral mucosa - sore throat or hoarseness - Damage to heart, brain, lungs or loss of life  Patient voiced understanding.)        Anesthesia Quick Evaluation

## 2018-06-28 NOTE — Anesthesia Post-op Follow-up Note (Signed)
Anesthesia QCDR form completed.        

## 2018-06-28 NOTE — H&P (Signed)
[facility] History & Physical Update  The patient was interviewed and re-examined.  The patient's previous History and Physical has been reviewed and is unchanged.  There is no change in the plan of care. We plan to proceed with the scheduled procedure.  Hortencia Pilar, MD  06/28/2018, 1:59 PM

## 2018-06-28 NOTE — Transfer of Care (Signed)
Immediate Anesthesia Transfer of Care Note  Patient: Paul Haynes  Procedure(s) Performed: BIOPSY TEMPORAL ARTERY (Right )  Patient Location: PACU  Anesthesia Type:General  Level of Consciousness: sedated  Airway & Oxygen Therapy: Patient Spontanous Breathing and Patient connected to face mask oxygen  Post-op Assessment: Report given to RN and Post -op Vital signs reviewed and stable  Post vital signs: Reviewed and stable  Last Vitals:  Vitals Value Taken Time  BP 97/57 06/28/18 1503  Temp    Pulse 50 06/28/18 1505  Resp 16 06/28/18 1505  SpO2 98 % 06/28/18 1505  Vitals shown include unvalidated device data.  Last Pain:  Vitals:   06/28/18 0911  TempSrc: Tympanic  PainSc: 0-No pain         Complications: No apparent anesthesia complications

## 2018-06-28 NOTE — Progress Notes (Signed)
Patient told his RN that he had black coffee at 0730. Told Anesthesia he had orange juice at 0730. Per Dr Carroll Kinds do not cancel yet wants to see how his day is going to be since patient is delayed until 1330.

## 2018-06-28 NOTE — Discharge Instructions (Signed)

## 2018-06-28 NOTE — Op Note (Signed)
        OPERATIVE NOTE   PRE-OPERATIVE DIAGNOSIS: suspected temporal arteritis, right-sided visual changes associated with right-sided headache  POST-OPERATIVE DIAGNOSIS: Same as above  PROCEDURE: 1.   Right temporal artery biopsy  SURGEON: Hortencia Pilar, MD  ASSISTANT(S): Ms. Hezzie Bump  ANESTHESIA: MAC  ESTIMATED BLOOD LOSS: Minimal  FINDING(S): 1.  none  SPECIMEN(S):  Right  superficial temporal artery sent to pathology  INDICATIONS:   Patient is a 63 y.o. male who presents with right-sided visual changes associated with a right-sided periocular headache. We were consulted for consideration for temporal artery biopsy. Risks and benefits were discussed and he was agreeable to proceed.  DESCRIPTION: After obtaining full informed written consent, the patient was brought back to the operating room and placed supine upon the operating table.  The patient received IV antibiotics prior to induction.  After obtaining adequate anesthesia, the patient was prepped and draped in the standard fashion.   A first assistant is required in order to allow for a safe and more efficient operation.  Duties include retraction of tissues to allow for optimal exposure, assisting with suture ligation of vessels as well as maintaining a clear field of view with suction as needed.  Further duties include assisting with patient positioning during the surgery as well as wound closure.  I believe that this procedure requires a first assistant in order for it to be performed at a level in keeping with the high standards of this institution.  The area in front of his right ear was anesthetized copiously with a solution of 1% lidocaine and half percent Marcaine without epinephrine. I then made an incision just in front of the right ear overlying the palpable pulse. I then dissected down through the subcutaneous tissues and identified the superficial temporal artery. This was dissected out over a several  centimeters and branches were ligated and divided between silk ties. Care was used to avoid electrocautery around the artery. I then clamped the artery proximally and distally and transected the artery. The specimen was then sent to pathology. The proximal and distal artery were ligated with 3-0 silk ties. Hemostasis was achieved. The wound was then closed with a series of interrupted 3-0 Vicryl's and the skin was closed with a 4-0 Monocryl. Sterile dressing was placed. The patient was taken to the recovery room in stable condition having tolerated the procedure well.  COMPLICATIONS: None  CONDITION: Stable   Hortencia Pilar 06/28/2018 2:57 PM  This note was created with Dragon Medical transcription system. Any errors in dictation are purely unintentional.

## 2018-06-30 ENCOUNTER — Encounter: Payer: Self-pay | Admitting: Vascular Surgery

## 2018-07-01 NOTE — Anesthesia Postprocedure Evaluation (Signed)
Anesthesia Post Note  Patient: Paul Haynes  Procedure(s) Performed: BIOPSY TEMPORAL ARTERY (Right )  Patient location during evaluation: PACU Anesthesia Type: General Level of consciousness: awake and alert Pain management: pain level controlled Vital Signs Assessment: post-procedure vital signs reviewed and stable Respiratory status: spontaneous breathing, nonlabored ventilation and respiratory function stable Cardiovascular status: blood pressure returned to baseline and stable Postop Assessment: no apparent nausea or vomiting Anesthetic complications: no     Last Vitals:  Vitals:   06/28/18 1548 06/28/18 1605  BP: (!) 117/57 (!) 129/55  Pulse: (!) 48 (!) 53  Resp: 15 16  Temp: (!) 36.3 C 36.6 C  SpO2: 97% 97%    Last Pain:  Vitals:   07/01/18 0852  TempSrc:   PainSc: [place]

## 2018-07-02 LAB — SURGICAL PATHOLOGY

## 2018-07-05 ENCOUNTER — Emergency Department
Admission: EM | Admit: 2018-07-05 | Discharge: 2018-07-06 | Disposition: A | Discharge status: Home / Self Care | Payer: Medicare Other | Attending: Emergency Medicine | Admitting: Emergency Medicine

## 2018-07-05 ENCOUNTER — Other Ambulatory Visit: Payer: Self-pay

## 2018-07-05 DIAGNOSIS — J449 Chronic obstructive pulmonary disease, unspecified: Secondary | ICD-10-CM | POA: Insufficient documentation

## 2018-07-05 DIAGNOSIS — T8189XA Other complications of procedures, not elsewhere classified, initial encounter: Secondary | ICD-10-CM | POA: Diagnosis not present

## 2018-07-05 DIAGNOSIS — Y69 Unspecified misadventure during surgical and medical care: Secondary | ICD-10-CM | POA: Diagnosis not present

## 2018-07-05 DIAGNOSIS — I10 Essential (primary) hypertension: Secondary | ICD-10-CM | POA: Diagnosis not present

## 2018-07-05 DIAGNOSIS — Z859 Personal history of malignant neoplasm, unspecified: Secondary | ICD-10-CM | POA: Diagnosis not present

## 2018-07-05 DIAGNOSIS — F17210 Nicotine dependence, cigarettes, uncomplicated: Secondary | ICD-10-CM | POA: Diagnosis not present

## 2018-07-05 DIAGNOSIS — Z79899 Other long term (current) drug therapy: Secondary | ICD-10-CM | POA: Insufficient documentation

## 2018-07-05 DIAGNOSIS — L7622 Postprocedural hemorrhage and hematoma of skin and subcutaneous tissue following other procedure: Secondary | ICD-10-CM | POA: Diagnosis present

## 2018-07-05 NOTE — ED Provider Notes (Signed)
[facility] Emergency Department Provider Note  Time seen: 12:09 PM  I have reviewed the triage vital signs and the nursing notes.   HISTORY  Chief Complaint Post-op Problem   HPI Paul Haynes is a 63 y.o. male with a past medical history of COPD, hypertension, hyperlipidemia, presents to the emergency department for postoperative bleeding.  According to the patient he had a temporal artery biopsy performed several days ago, awoke with bleeding from the site on his pillow, states he tried to get the bleeding stopped but could not so he came to the emergency department for evaluation.  Upon arrival here bleeding has stopped.  Patient has dried blood on the side of his head  denies any other symptoms at this time.  No recent fever cough congestion or shortness of breath.  Past Medical History:  Diagnosis Date  . Cancer ([place])   . COPD (chronic obstructive pulmonary disease) ([place])   . 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 Hepatitis C   . High cholesterol   . Hypertension   . WPW (Wolff-Parkinson-White syndrome)     Patient Active Problem List   Diagnosis Date Noted  . COPD (chronic obstructive pulmonary disease) ( [place]) 06/14/2018  . Essential hypertension 06/14/2018  . Hyperlipidemia 06/14/2018  . Chronic right-sided headaches 06/14/2018    Past Surgical History:  Procedure Laterality Date  . ARTERY BIOPSY Right 06/28/2018   Procedure: BIOPSY TEMPORAL ARTERY;  Surgeon: Katha Cabal, MD;  Location: [facility] ORS;  Service: Vascular;  Laterality: Right;  . CATARACT EXTRACTION W/PHACO Right 06/19/2017   Procedure: CATARACT EXTRACTION PHACO AND INTRAOCULAR LENS PLACEMENT (IOC);  Surgeon: Birder Robson, MD;  Location: [facility] ORS;  Service: Ophthalmology;  Laterality: Right;  [place] 00:40 AP% 17.9 CDE 7.3 Fluid pack lot 3 [id number]  . SUBDURAL HEMATOMA EVACUATION VIA CRANIOTOMY    . TONSILLECTOMY      Prior to Admission medications   Medication Sig Start Date End Date  Taking? Authorizing Provider  albuterol-ipratropium (COMBIVENT) 18-103 MCG/ACT inhaler Inhale 1 puff into the lungs 4 (four) times daily.     [provider]  amLODipine (NORVASC) 10 MG tablet Take 10 mg by mouth daily.    [provider]  Buprenorphine HCl-Naloxone HCl 8-2 MG FILM  09/14/17   [provider]  buPROPion (WELLBUTRIN SR) 150 MG 12 hr tablet Take 150 mg by mouth 2 (two) times daily.    [provider]  celecoxib (CELEBREX) 200 MG capsule Take 200 mg by mouth 2 (two) times daily.    [provider]  Fluticasone-Salmeterol (ADVAIR DISKUS) 100-50 MCG/DOSE AEPB Take 1 puff by mouth 2 (two) times daily. 03/20/17   [provider]  guaiFENesin-codeine 100-10 MG/5ML syrup Take 10 mLs by mouth every 6 (six) hours as needed for cough. Patient not taking: Reported on 01/13/2015 06/23/14   Harvest Dark, MD  hydrochlorothiazide (HYDRODIURIL) 25 MG tablet Take 25 mg by mouth daily.    [provider]  HYDROcodone-acetaminophen (NORCO) 5-325 MG tablet Take 1 tablet by mouth every 4 (four) hours as needed for moderate pain. Patient not taking: Reported on 06/14/2017 04/01/15   Mortimer Fries, PA-C  ibuprofen (ADVIL,MOTRIN) 800 MG tablet Take 1 tablet (800 mg total) by mouth every 8 (eight) hours as needed. Patient not taking: Reported on 06/14/2017 04/01/15   Mortimer Fries, PA-C  ketorolac (TORADOL) 10 MG tablet Take 1 tablet (10 mg total) by mouth every 6 (six) hours as needed. Patient not taking: Reported on 06/14/2017 04/04/17  Laban Emperor, PA-C  levofloxacin (LEVAQUIN) 750 MG tablet Take 1 tablet (750 mg total) by mouth daily. Patient not taking: Reported on 01/13/2015 06/13/14   Carrie Mew, MD  metoprolol tartrate (LOPRESSOR) 50 MG tablet Take 25 mg by mouth 2 (two) times daily.     [provider]  naproxen (NAPROSYN) 500 MG tablet Take 1 tablet (500 mg total) by mouth 2 (two) times daily with a meal. Patient not taking:  Reported on 06/14/2017 11/05/15   Sable Feil, PA-C  omeprazole (PRILOSEC) 20 MG capsule Take 20 mg by mouth daily.    [provider]  ondansetron (ZOFRAN ODT) 4 MG disintegrating tablet Take 1 tablet (4 mg total) by mouth every 8 (eight) hours as needed for nausea or vomiting. Patient not taking: Reported on 06/14/2017 01/13/15   Harvest Dark, MD  POTASSIUM GLUCONATE PO Take 1 tablet by mouth daily.    [provider]  pravastatin (PRAVACHOL) 20 MG tablet Take 20 mg by mouth daily.    [provider]  predniSONE (DELTASONE) 20 MG tablet Take 2 tablets (40 mg total) by mouth daily. Patient not taking: Reported on 01/13/2015 06/13/14   Carrie Mew, MD  traMADol (ULTRAM) 50 MG tablet Take 1 tablet (50 mg total) by mouth every 6 (six) hours as needed for moderate pain. 06/28/18   Schnier, Dolores Lory, MD    Allergies  Allergen Reactions  . Penicillins Anaphylaxis    Childhood allergy Has patient had a PCN reaction causing immediate rash, facial/tongue/throat swelling, SOB or lightheadedness with hypotension: Yes Has patient had a PCN reaction causing severe rash involving mucus membranes or skin necrosis: Unknown Has patient had a PCN reaction that required hospitalization: No Has patient had a PCN reaction occurring within the last 10 years: No If all of the above answers are "NO", then may proceed with Cephalosporin use.     Family History  Problem Relation Age of Onset  . Heart attack Mother   . Hypertension Mother   . Hyperlipidemia Mother   . Diabetes Mother   . Heart disease Mother   . COPD Father   . Bronchitis Father     Social History Social History   Tobacco Use  . Smoking status: Current Every Day Smoker    Packs/day: 1.00    Types: Cigarettes  . Smokeless tobacco: Never Used  Substance Use Topics  . Alcohol use: No  . Drug use: No    Review of Systems Constitutional: Negative for fever ENT: Negative for recent  illness/congestion Cardiovascular: Negative for chest pain. Respiratory: Negative for shortness of breath. Gastrointestinal: Negative for abdominal pain Skin: Bleeding to right side of head. Neurological: Negative for headache All other ROS negative  ____________________________________________   PHYSICAL EXAM:  VITAL SIGNS: ED Triage Vitals  Enc Vitals Group     BP 07/05/18 1117 (!) 120/57     Pulse Rate 07/05/18 1117 (!) 55     Resp 07/05/18 1117 15     Temp 07/05/18 1117 98.2 F (36.8 C)     Temp Source 07/05/18 1117 Oral     SpO2 07/05/18 1117 98 %     Weight 07/05/18 1115 170 lb (77.1 kg)     Height 07/05/18 1115 5\' 7"  (1.702 m)     Head Circumference --      Peak Flow --      Pain Score 07/05/18 1114 0     Pain Loc --      Pain Edu? --  Excl. in [place]? --     Constitutional: Alert and oriented. Well appearing and in no distress. Eyes: Normal exam ENT      Head: Normocephalic and atraumatic.      Mouth/Throat: Mucous membranes are moist. Cardiovascular: Normal rate, regular rhythm. Respiratory: Normal respiratory effort without tachypnea nor retractions. Breath sounds are clear  Gastrointestinal: Soft and nontender. No distention.  Musculoskeletal: Nontender with normal range of motion in all extremities.  Neurologic:  Normal speech and language.  Skin: Patient has dried blood to the right side of his head, no active bleeding Psychiatric: Mood and affect are normal.  ____________________________________________   INITIAL IMPRESSION / ASSESSMENT AND PLAN / ED COURSE  Pertinent labs & imaging results that were available during my care of the patient were reviewed by me and considered in my medical decision making (see chart for details).   Patient presents emergency department for postoperative bleeding, has bleeding to the right side of his head from a recent temporal artery biopsy.  Currently hemostatic but has moderate amount of dried blood on the head.  We  will clean the head to see if repair or intervention is required  After cleaning the area I was able to remove all the dried blood and coagulated blood.  He has an approximate 4 cm incision just anterior to the right ear.  There is a small area of bleeding, this was stopped with pressure and Dermabond was reapplied over the wound, with excellent hemostasis.  Monitored in the emergency department remained hemostatic we will discharge home.  Patient is to follow-up with his doctor.  Paul Haynes was evaluated in Emergency Department on 07/05/2018 for the symptoms described in the history of present illness. He was evaluated in the context of the global COVID-19 pandemic, which necessitated consideration that the patient might be at risk for infection with the SARS-CoV-2 virus that causes COVID-19. Institutional protocols and algorithms that pertain to the evaluation of patients at risk for COVID-19 are in a state of rapid change based on information released by regulatory bodies including the CDC and federal and state organizations. These policies and algorithms were followed during the patient's care in the ED.  ____________________________________________   FINAL CLINICAL IMPRESSION(S) / ED DIAGNOSES  Postoperative bleeding    Harvest Dark, MD 07/05/18 1408

## 2018-07-05 NOTE — ED Triage Notes (Signed)
t had surgery last week ("vein removed from temple for a biopsy" - this am pt reports that this am he started bleeding from the excision site - no bleeding at this time - denies pain

## 2018-07-05 NOTE — ED Notes (Signed)
ED Provider Padochowski  at bedside.

## 2018-07-05 NOTE — Discharge Instructions (Addendum)
Please follow-up with your doctor regarding your surgical incision.  This has been repaired in the emergency department with Dermabond a skin adhesive.  Please keep the area dry for the next 48 hours.  Return to the emergency department for any significant bleeding or any other symptom personally concerning to yourself.

## 2018-07-13 ENCOUNTER — Emergency Department
Admission: EM | Admit: 2018-07-13 | Discharge: 2018-07-13 | Disposition: A | Discharge status: Other Acute Inpatient Hospital | Payer: Medicare Other | Attending: Emergency Medicine | Admitting: Emergency Medicine

## 2018-07-13 ENCOUNTER — Emergency Department: Payer: Medicare Other

## 2018-07-13 ENCOUNTER — Encounter: Payer: Self-pay | Admitting: Emergency Medicine

## 2018-07-13 ENCOUNTER — Other Ambulatory Visit: Payer: Self-pay

## 2018-07-13 DIAGNOSIS — C7931 Secondary malignant neoplasm of brain: Secondary | ICD-10-CM | POA: Insufficient documentation

## 2018-07-13 DIAGNOSIS — I10 Essential (primary) hypertension: Secondary | ICD-10-CM | POA: Diagnosis not present

## 2018-07-13 DIAGNOSIS — R42 Dizziness and giddiness: Secondary | ICD-10-CM | POA: Diagnosis present

## 2018-07-13 DIAGNOSIS — Z79899 Other long term (current) drug therapy: Secondary | ICD-10-CM | POA: Diagnosis not present

## 2018-07-13 DIAGNOSIS — R202 Paresthesia of skin: Secondary | ICD-10-CM | POA: Diagnosis not present

## 2018-07-13 DIAGNOSIS — D68311 Acquired hemophilia: Secondary | ICD-10-CM | POA: Diagnosis not present

## 2018-07-13 DIAGNOSIS — J449 Chronic obstructive pulmonary disease, unspecified: Secondary | ICD-10-CM | POA: Diagnosis not present

## 2018-07-13 DIAGNOSIS — F17210 Nicotine dependence, cigarettes, uncomplicated: Secondary | ICD-10-CM | POA: Diagnosis not present

## 2018-07-13 LAB — CBC
HCT: 41.2 % (ref 39.0–52.0)
Hemoglobin: 14.3 g/dL (ref 13.0–17.0)
MCH: 29 pg (ref 26.0–34.0)
MCHC: 34.7 g/dL (ref 30.0–36.0)
MCV: 83.6 fL (ref 80.0–100.0)
Platelets: 282 10*3/uL (ref 150–400)
RBC: 4.93 MIL/uL (ref 4.22–5.81)
RDW: 13 % (ref 11.5–15.5)
WBC: 9.8 10*3/uL (ref 4.0–10.5)
nRBC: 0 % (ref 0.0–0.2)

## 2018-07-13 LAB — COMPREHENSIVE METABOLIC PANEL
ALT: 19 U/L (ref 0–44)
AST: 24 U/L (ref 15–41)
Albumin: 3.6 g/dL (ref 3.5–5.0)
Alkaline Phosphatase: 105 U/L (ref 38–126)
Anion gap: 12 (ref 5–15)
BUN: 14 mg/dL (ref 8–23)
CO2: 30 mmol/L (ref 22–32)
Calcium: 9.7 mg/dL (ref 8.9–10.3)
Chloride: 88 mmol/L — ABNORMAL LOW (ref 98–111)
Creatinine, Ser: 1.07 mg/dL (ref 0.61–1.24)
GFR calc Af Amer: >60 mL/min (ref >60)
GFR calc non Af Amer: >60 mL/min (ref >60)
Glucose, Bld: 139 mg/dL — ABNORMAL HIGH (ref 70–99)
Potassium: 3 mmol/L — ABNORMAL LOW (ref 3.5–5.1)
Sodium: 130 mmol/L — ABNORMAL LOW (ref 135–145)
Total Bilirubin: 0.6 mg/dL (ref 0.3–1.2)
Total Protein: 8.2 g/dL — ABNORMAL HIGH (ref 6.5–8.1)

## 2018-07-13 LAB — DIFFERENTIAL
Abs Immature Granulocytes: 0.05 10*3/uL (ref 0.00–0.07)
Basophils Absolute: 0.1 10*3/uL (ref 0.0–0.1)
Basophils Relative: 1 %
Eosinophils Absolute: 0.2 10*3/uL (ref 0.0–0.5)
Eosinophils Relative: 2 %
Immature Granulocytes: 1 %
Lymphocytes Relative: 20 %
Lymphs Abs: 2 10*3/uL (ref 0.7–4.0)
Monocytes Absolute: 0.8 10*3/uL (ref 0.1–1.0)
Monocytes Relative: 8 %
Neutro Abs: 6.7 10*3/uL (ref 1.7–7.7)
Neutrophils Relative %: 68 %

## 2018-07-13 LAB — APTT: aPTT: 61 seconds — ABNORMAL HIGH (ref 24–36)

## 2018-07-13 LAB — PROTIME-INR
INR: 1 (ref 0.8–1.2)
Prothrombin Time: 13.2 seconds (ref 11.4–15.2)

## 2018-07-13 MED ORDER — SODIUM CHLORIDE 0.9 % IV BOLUS
500.0000 mL | Freq: Once | INTRAVENOUS | Status: AC
  Administered 2018-07-13: 500 mL via INTRAVENOUS

## 2018-07-13 NOTE — ED Triage Notes (Signed)
Patient presents to the ED with dizziness that began yesterday morning and has gotten increasingly worse as well as tingling/numbness to left side.  Patient had a biopsy on a vein on the right side of his face approx. 2 weeks ago.  Patient states he noticed wound has begun to swell near his right ear that began this morning.  Area has dried blood around it. No obvious redness, some swelling noted.  Patient states he has had severe headache for several months.

## 2018-07-13 NOTE — ED Provider Notes (Signed)
[facility] Emergency Department Provider Note  ____________________________________________  Time seen: Approximately 2:52 PM  I have reviewed the triage vital signs and the nursing notes.   HISTORY  Chief Complaint Dizziness and Post-op Problem    HPI Paul Haynes is a 63 y.o. male with a history of COPD, hepatitis C, hypertension, hemophilia who comes to the ED complaining of dizziness and left-sided paresthesia that started yesterday, constant, no aggravating or alleviating factors.  Denies motor weakness or recent trauma.  No falls.  Reports having a right facial biopsy done about 2 weeks ago.  He is had persistent swelling of the area since then.  Denies fever.      Past Medical History:  Diagnosis Date  . Cancer ([place])   . COPD (chronic obstructive pulmonary disease) ([place])   . 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 Hepatitis C   . High cholesterol   . Hypertension   . WPW (Wolff-Parkinson-White syndrome)      Patient Active Problem List   Diagnosis Date Noted  . COPD (chronic obstructive pulmonary disease) ([place]) 06/14/2018  . Essential hypertension 06/14/2018  . Hyperlipidemia 06/14/2018  . Chronic right-sided headaches 06/14/2018     Past Surgical History:  Procedure Laterality Date  . ARTERY BIOPSY Right 06/28/2018   Procedure: BIOPSY TEMPORAL ARTERY;  Surgeon: Katha Cabal, MD;  Location: [facility] ORS;  Service: Vascular;  Laterality: Right;  . CATARACT EXTRACTION W/PHACO Right 06/19/2017   Procedure: CATARACT EXTRACTION PHACO AND INTRAOCULAR LENS PLACEMENT (IOC);  Surgeon: Birder Robson, MD;  Location: [facility] ORS;  Service: Ophthalmology;  Laterality: Right;  [place] 00:40 AP% 17.9 CDE 7.3 Fluid pack lot 3 [id number]  . SUBDURAL HEMATOMA EVACUATION VIA CRANIOTOMY    . TONSILLECTOMY       Prior to Admission medications   Medication Sig Start Date End Date Taking? Authorizing Provider  albuterol-ipratropium (COMBIVENT) 18-103 MCG/ACT inhaler  Inhale 1 puff into the lungs 4 (four) times daily.    Yes [provider]  amLODipine (NORVASC) 10 MG tablet Take 10 mg by mouth daily.   Yes [provider]  Buprenorphine HCl-Naloxone HCl 8-2 MG FILM Place 1 Film under the tongue 3 (three) times daily.  09/14/17  Yes [provider]  buPROPion (WELLBUTRIN SR) 150 MG 12 hr tablet Take 150 mg by mouth 2 (two) times daily.   Yes [provider]  celecoxib (CELEBREX) 200 MG capsule Take 200 mg by mouth 2 (two) times daily.   Yes [provider]  Fluticasone-Salmeterol (ADVAIR DISKUS) 100-50 MCG/DOSE AEPB Take 1 puff by mouth 2 (two) times daily. 03/20/17  Yes [provider]  hydrochlorothiazide (HYDRODIURIL) 25 MG tablet Take 25 mg by mouth daily.   Yes [provider]  metoprolol tartrate (LOPRESSOR) 50 MG tablet Take 25 mg by mouth 2 (two) times daily.    Yes [provider]  omeprazole (PRILOSEC) 20 MG capsule Take 20 mg by mouth daily.   Yes [provider]  POTASSIUM GLUCONATE PO Take 1 tablet by mouth daily.   Yes [provider]  pravastatin (PRAVACHOL) 20 MG tablet Take 20 mg by mouth daily.   Yes [provider]  ibuprofen (ADVIL,MOTRIN) 800 MG tablet Take 1 tablet (800 mg total) by mouth every 8 (eight) hours as needed. Patient not taking: Reported on 06/14/2017 04/01/15   Mortimer Fries, PA-C     Allergies Penicillins   Family History  Problem Relation Age of Onset  . Heart attack Mother   .  Hypertension Mother   . Hyperlipidemia Mother   . Diabetes Mother   . Heart disease Mother   . COPD Father   . Bronchitis Father     Social History Social History   Tobacco Use  . Smoking status: Current Every Day Smoker    Packs/day: 1.00    Types: Cigarettes  . Smokeless tobacco: Never Used  Substance Use Topics  . Alcohol use: No  . Drug use: No    Review of Systems  Constitutional:   No fever or chills.  ENT:   No sore throat.  No rhinorrhea. Cardiovascular:   No chest pain or syncope. Respiratory:   No dyspnea or cough. Gastrointestinal:   Negative for abdominal pain, vomiting and diarrhea.  Musculoskeletal:   Negative for focal pain or swelling All other systems reviewed and are negative except as documented above in ROS and HPI.  ____________________________________________   PHYSICAL EXAM:  VITAL SIGNS: ED Triage Vitals  Enc Vitals Group     BP 07/13/18 1104 (!) 140/101     Pulse Rate 07/13/18 1104 74     Resp 07/13/18 1104 16     Temp 07/13/18 1104 98.8 F (37.1 C)     Temp Source 07/13/18 1104 Oral     SpO2 07/13/18 1104 97 %     Weight 07/13/18 1105 170 lb (77.1 kg)     Height 07/13/18 1105 5\' 7"  (1.702 m)     Head Circumference --      Peak Flow --      Pain Score 07/13/18 1104 3     Pain Loc --      Pain Edu? --      Excl. in [place]? --     Vital signs reviewed, nursing assessments reviewed.   Constitutional:   Alert and oriented. Non-toxic appearance. Eyes:   Conjunctivae are normal. EOMI. PERRL. ENT      Head:   Normocephalic and atraumatic.  Swelling and tenderness over the right preauricular face.  External canal unremarkable, TM unremarkable.  Not fluctuant      Nose:   No congestion/rhinnorhea.       Mouth/Throat:   MMM, no pharyngeal erythema. No peritonsillar mass.       Neck:   No meningismus. Full ROM.  Midline tenderness Hematological/Lymphatic/Immunilogical:   No cervical lymphadenopathy. Cardiovascular:   RRR. Symmetric bilateral radial and DP pulses.  No murmurs. Cap refill less than 2 seconds. Respiratory:   Normal respiratory effort without tachypnea/retractions. Breath sounds are clear and equal bilaterally. No wheezes/rales/rhonchi. Gastrointestinal:   Soft and nontender. Non distended. There is no CVA tenderness.  No rebound, rigidity, or guarding.  Musculoskeletal:   Normal range of motion in all extremities. No joint effusions.  No lower extremity tenderness.  No  edema. Neurologic:   Normal speech and language. Cranial nerves III through XII intact Motor grossly intact. Sensation in left arm and left leg subjectively diminished NIH stroke scale 2 for paresthesias Skin:    Skin is warm, dry and intact. No rash noted.  No petechiae, purpura, or bullae.  ____________________________________________    LABS (pertinent positives/negatives) (all labs ordered are listed, but only abnormal results are displayed) Labs Reviewed  APTT - Abnormal; Notable for the following components:      Result Value   aPTT 61 (*)    All other components within normal limits  COMPREHENSIVE METABOLIC PANEL - Abnormal; Notable for the following components:   Sodium 130 (*)    Potassium 3.0 (*)  Chloride 88 (*)    Glucose, Bld 139 (*)    Total Protein 8.2 (*)    All other components within normal limits  PROTIME-INR  CBC  DIFFERENTIAL  I-STAT CREATININE, ED  CBG MONITORING, ED   ____________________________________________   EKG  Interpreted by me Normal sinus rhythm rate of 74, normal axis and intervals.  Poor R wave progression.  Normal ST segments and T waves.  ____________________________________________    RADIOLOGY  Ct Head Wo Contrast  Result Date: 07/13/2018 CLINICAL DATA:  63 year old male with dizziness and possible facial infection EXAM: CT HEAD WITHOUT CONTRAST CT MAXILLOFACIAL WITHOUT CONTRAST TECHNIQUE: Multidetector CT imaging of the head and maxillofacial structures were performed using the standard protocol without intravenous contrast. Multiplanar CT image reconstructions of the maxillofacial structures were also generated. COMPARISON:  Prior CT scan of the face 05/07/2016 FINDINGS: CT HEAD FINDINGS Brain: Approximately 9 mm round low-attenuation lesion in the parafalcine subcortical white matter of the right frontal lobe with surrounding vasogenic edema. Similarly, there is a approximately 1 cm low-attenuation lesion in the right aspect  of the pons with surrounding low attenuation consistent with edema extending into the cerebellar pedicle as well as superiorly into the midbrain and cerebral peduncle. No evidence of acute hemorrhage or infarction. Vascular: No hyperdense vessel or unexpected calcification. Skull: Normal. Negative for fracture or focal lesion. Other: None. CT MAXILLOFACIAL FINDINGS Osseous: No fracture or mandibular dislocation. No destructive process. Orbits: Negative. No traumatic or inflammatory finding. Sinuses: Clear. Soft tissues: Focal soft tissue density in the subcutaneous fat in the preauricular space anterior to the external auditory canal. No focal fluid collection to suggest abscess. Surgical changes of prior right neck dissection. IMPRESSION: CT HEAD 1. CT findings are concerning for metastatic disease to the brain. Specifically, a lesion is identified in the subcortical white matter of the parafalcine right frontal lobe with surrounding vasogenic edema and a second lesion is identified in the right hemi pons with extensive surrounding edema extending into the cerebral and cerebellar peduncles. Recommend further evaluation with MRI of the brain with and without gadolinium contrast. CT FACE 1. Nonspecific soft tissue density anterior to the right sided external auditory canal in the preauricular space. Differential considerations include hematoma and inflammation given the clinical history of recent biopsy. 2. Surgical changes of prior right neck dissection. Electronically Signed   By: Jacqulynn Cadet M.D.   On: 07/13/2018 13:22   Ct Maxillofacial Wo Contrast  Result Date: 07/13/2018 CLINICAL DATA:  63 year old male with dizziness and possible facial infection EXAM: CT HEAD WITHOUT CONTRAST CT MAXILLOFACIAL WITHOUT CONTRAST TECHNIQUE: Multidetector CT imaging of the head and maxillofacial structures were performed using the standard protocol without intravenous contrast. Multiplanar CT image reconstructions of the  maxillofacial structures were also generated. COMPARISON:  Prior CT scan of the face 05/07/2016 FINDINGS: CT HEAD FINDINGS Brain: Approximately 9 mm round low-attenuation lesion in the parafalcine subcortical white matter of the right frontal lobe with surrounding vasogenic edema. Similarly, there is a approximately 1 cm low-attenuation lesion in the right aspect of the pons with surrounding low attenuation consistent with edema extending into the cerebellar pedicle as well as superiorly into the midbrain and cerebral peduncle. No evidence of acute hemorrhage or infarction. Vascular: No hyperdense vessel or unexpected calcification. Skull: Normal. Negative for fracture or focal lesion. Other: None. CT MAXILLOFACIAL FINDINGS Osseous: No fracture or mandibular dislocation. No destructive process. Orbits: Negative. No traumatic or inflammatory finding. Sinuses: Clear. Soft tissues: Focal soft tissue density in the  subcutaneous fat in the preauricular space anterior to the external auditory canal. No focal fluid collection to suggest abscess. Surgical changes of prior right neck dissection. IMPRESSION: CT HEAD 1. CT findings are concerning for metastatic disease to the brain. Specifically, a lesion is identified in the subcortical white matter of the parafalcine right frontal lobe with surrounding vasogenic edema and a second lesion is identified in the right hemi pons with extensive surrounding edema extending into the cerebral and cerebellar peduncles. Recommend further evaluation with MRI of the brain with and without gadolinium contrast. CT FACE 1. Nonspecific soft tissue density anterior to the right sided external auditory canal in the preauricular space. Differential considerations include hematoma and inflammation given the clinical history of recent biopsy. 2. Surgical changes of prior right neck dissection. Electronically Signed   By: Jacqulynn Cadet M.D.   On: 07/13/2018 13:22     ____________________________________________   PROCEDURES Procedures  ____________________________________________  DIFFERENTIAL DIAGNOSIS   Intracranial hemorrhage, stroke, dehydration, metabolic disorder  CLINICAL IMPRESSION / ASSESSMENT AND PLAN / ED COURSE  Medications ordered in the ED: Medications  sodium chloride 0.9 % bolus 500 mL (500 mLs Intravenous New Bag/Given 07/13/18 1432)    Pertinent labs & imaging results that were available during my care of the patient were reviewed by me and considered in my medical decision making (see chart for details).  Paul Haynes was evaluated in Emergency Department on 07/13/2018 for the symptoms described in the history of present illness. He was evaluated in the context of the global COVID-19 pandemic, which necessitated consideration that the patient might be at risk for infection with the SARS-CoV-2 virus that causes COVID-19. Institutional protocols and algorithms that pertain to the evaluation of patients at risk for COVID-19 are in a state of rapid change based on information released by regulatory bodies including the CDC and federal and state organizations. These policies and algorithms were followed during the patient's care in the ED.   Patient presents with new left-sided paresthesia concerning for stroke.  Review of electronic medical record also shows that in March of this year he had biopsies of skin lesions done by [facility] dermatology which turned out to be basal cell carcinoma.  CT scan shows 2 lesions in the brain concerning for metastatic disease, 1 in the right parafalcine cortex corresponding to sensory area likely, one in the right pons.  There is some vasogenic edema.  Case was discussed with [facility] patient logistics who reports that neurosurgery had requested the patient be transferred to the [facility] ED for further evaluation.  I discussed with [facility] emergency medicine attending Dr. Alita Chyle who accepts for transfer.  Patient has  normal mental status and vital signs, not requiring airway protection for transfer, seizure prophylaxis, or Decadron at this time.      ____________________________________________   FINAL CLINICAL IMPRESSION(S) / ED DIAGNOSES    Final diagnoses:  Brain metastases ([place])  Paresthesia of left arm  Paresthesia of left leg     ED Discharge Orders    None      Portions of this note were generated with dragon dictation software. Dictation errors may occur despite best attempts at proofreading.   Carrie Mew, MD 07/13/18 [phone]

## 2018-07-13 NOTE — ED Notes (Signed)
Pt off the unit in CT  

## 2018-07-13 NOTE — ED Notes (Signed)
emtala reviewed by this RN 

## 2018-07-18 ENCOUNTER — Ambulatory Visit (INDEPENDENT_AMBULATORY_CARE_PROVIDER_SITE_OTHER): Payer: Medicare Other | Admitting: Vascular Surgery

## 2018-10-10 DEATH — deceased

## 2020-06-23 IMAGING — MR MR LUMBAR SPINE W/O CM
5 series · 31 of 48 positions shown · non-contrast
Comparison: None.

CLINICAL DATA: Back pain extends into the right lower extremity the
knee. Numbness and tingling with walking. Chronic bilateral low back
pain with left-sided sciatica.

EXAM:
MRI LUMBAR SPINE WITHOUT CONTRAST
TECHNIQUE: Multiplanar, multisequence MR imaging of the lumbar spine was
performed. No intravenous contrast was administered.

[Series 5: T2 · sagittal · 4.0mm · 0.81mm/px · 6 of 17 slices shown (1 of 2)]
[im 1/17]
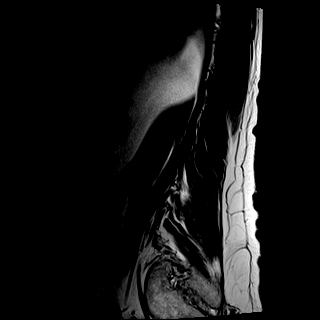
[im 4/17]
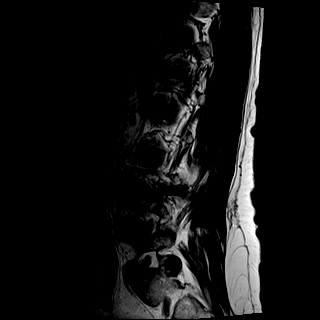
[im 7/17]
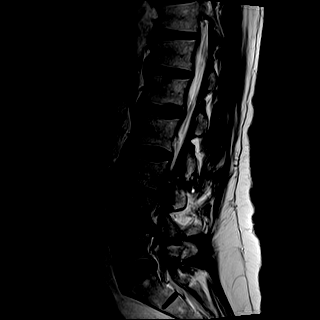
[im 10/17]
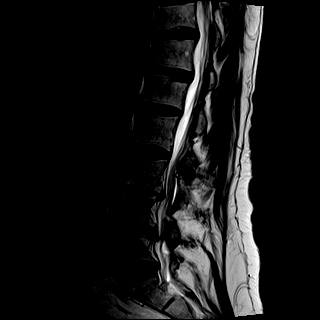
[im 13/17]
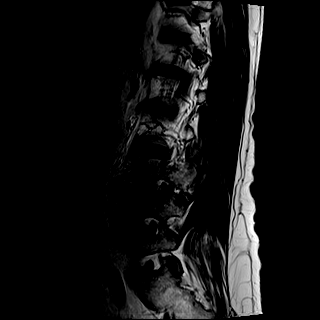
[im 17/17]
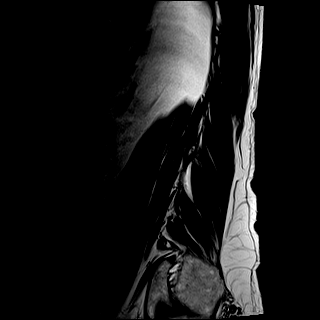

[Series 6: T1 · sagittal · 4.0mm · 0.81mm/px · 7 of 17 slices shown (1 of 2)]
[im 1/17]
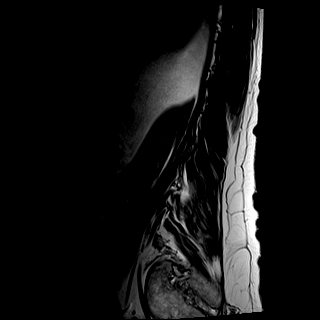
[im 3/17]
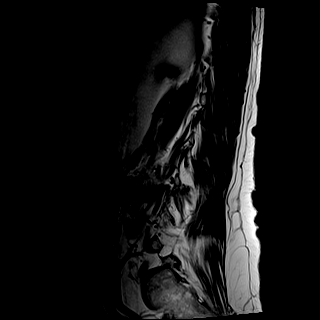
[im 6/17]
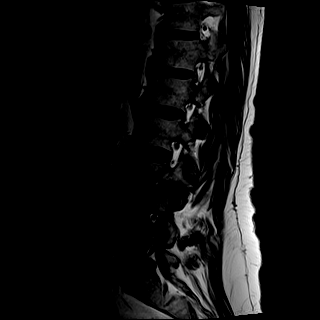
[im 9/17]
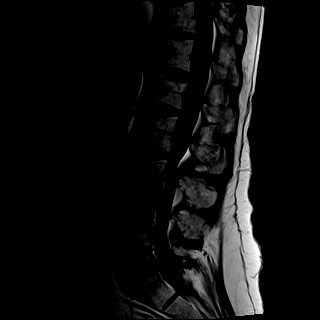
[im 11/17]
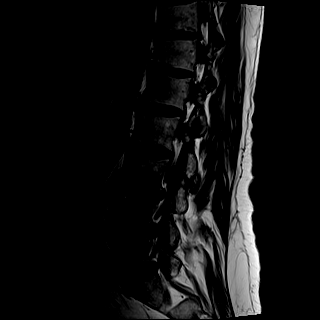
[im 14/17]
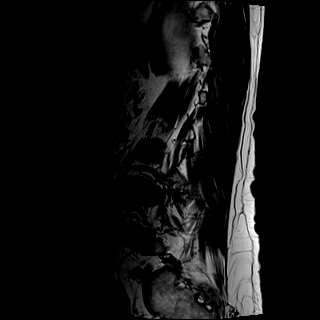
[im 17/17]
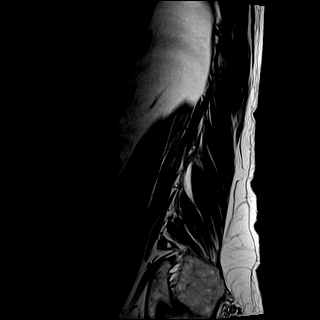

[Series 7: STIR · sagittal · 4.0mm · 0.41mm/px · 2 of 17 slices shown]
[im 1/17]
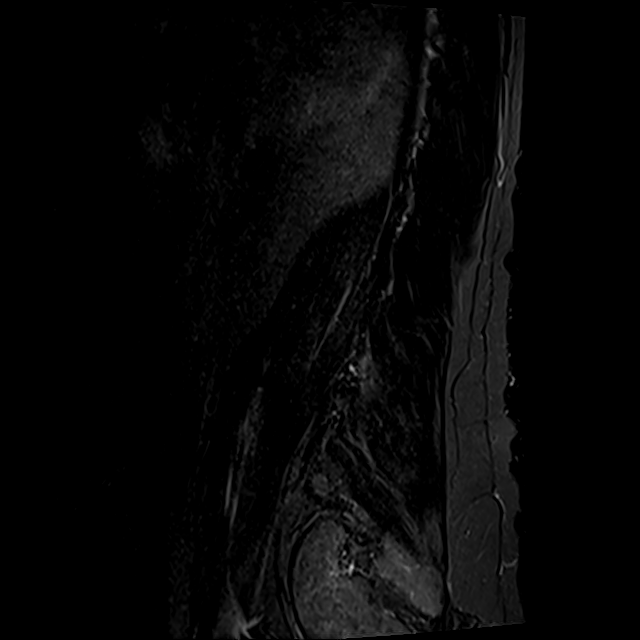
[im 3/17]
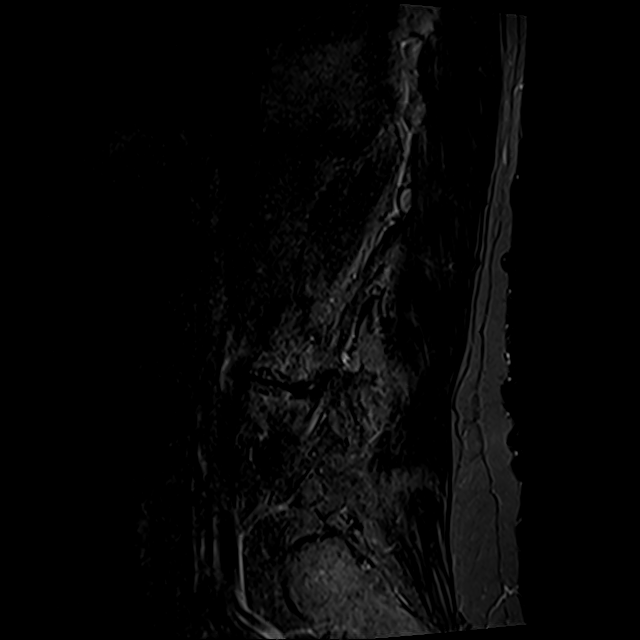

[Series 8: T2 · axial · 4.0mm · 0.78mm/px · z∈[-185,+22]mm · 8 of 37 slices shown (2 of 2)]
[im 1/37]
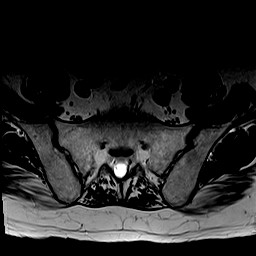
[im 6/37]
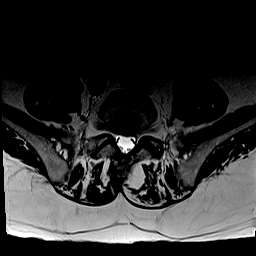
[im 12/37]
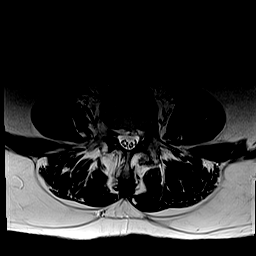
[im 17/37]
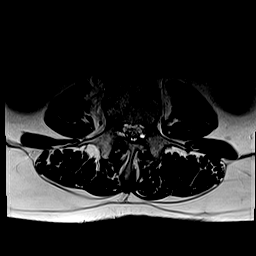
[im 20/37]
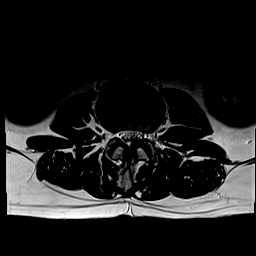
[im 25/37]
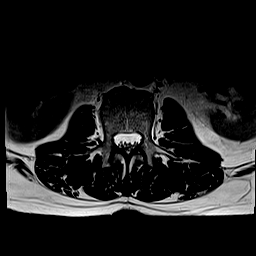
[im 31/37]
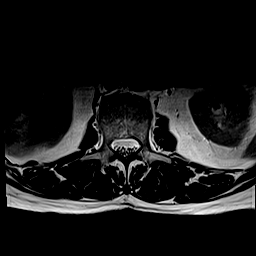
[im 37/37]
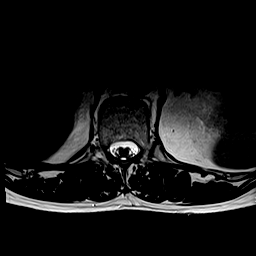

[Series 9: T1 · axial · 4.0mm · 0.39mm/px · z∈[-185,+22]mm · 8 of 37 slices shown (2 of 2)]
[im 1/37]
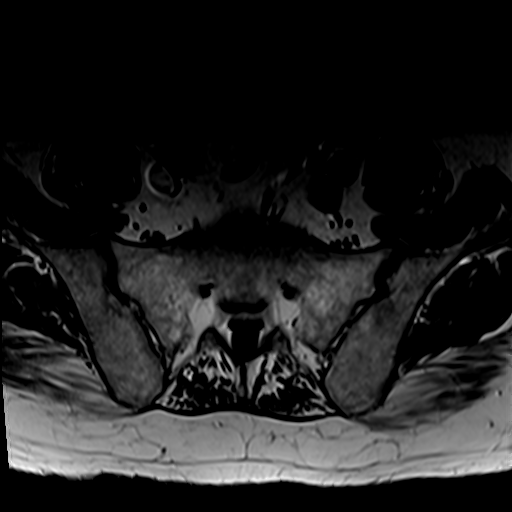
[im 6/37]
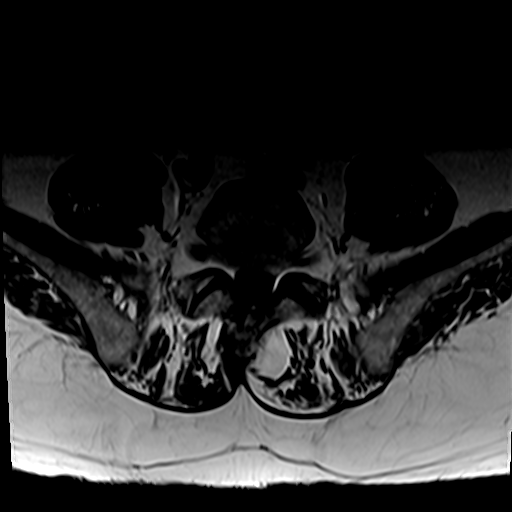
[im 12/37]
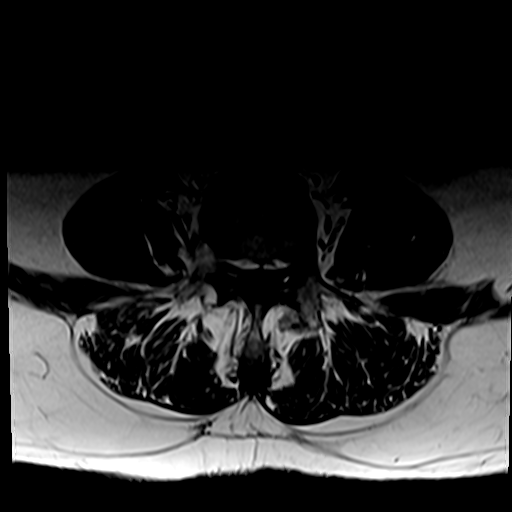
[im 17/37]
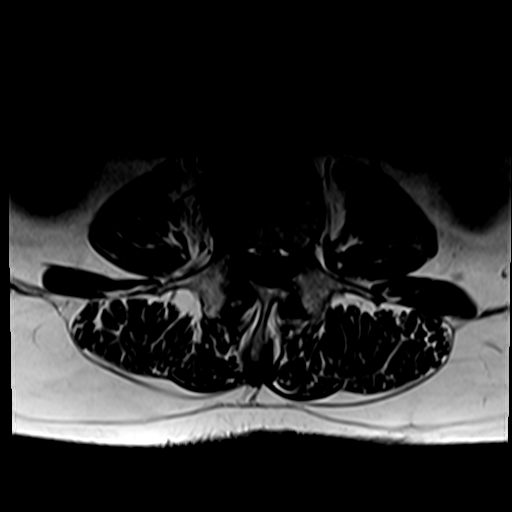
[im 20/37]
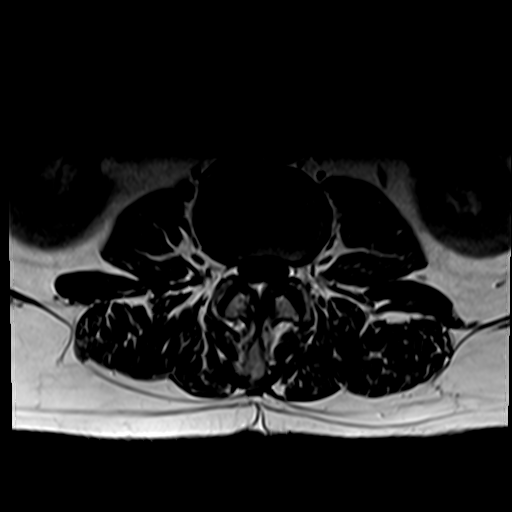
[im 25/37]
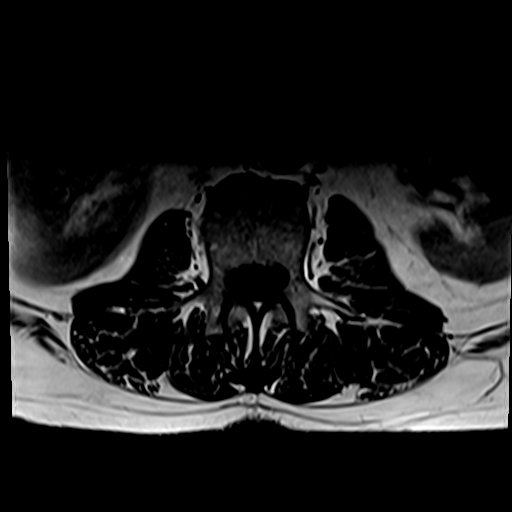
[im 31/37]
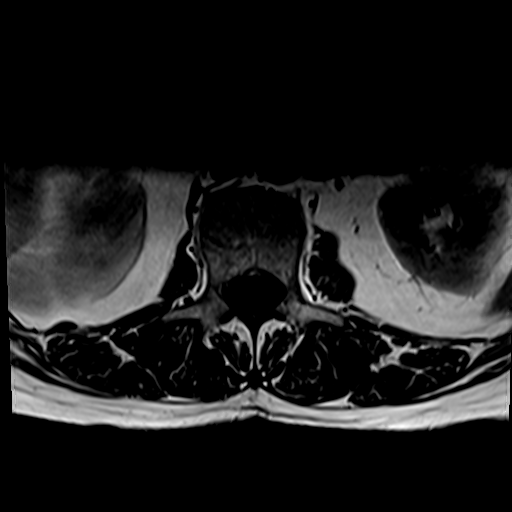
[im 37/37]
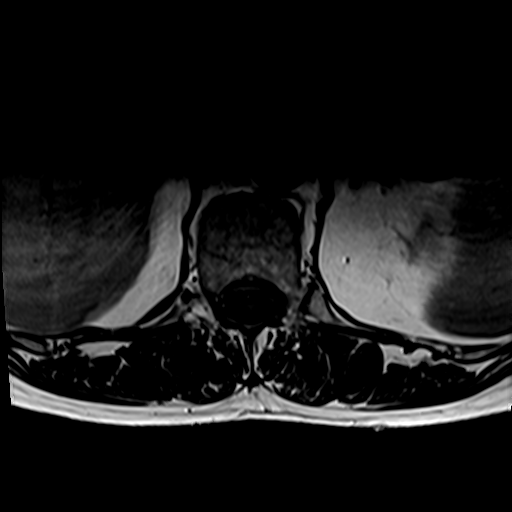

[31 of 48 positions shown; findings below may reference images not displayed]

FINDINGS: Segmentation: 5 non rib-bearing lumbar type vertebral bodies are
present. The lowest fully formed vertebral body is L5.

Alignment: Slight retrolisthesis is present at L3-4 and L4-5.
Levoconvex scoliosis is centered at L3-4. Rightward curvature is
centered at L1.

Vertebrae: Edematous endplate marrow changes are present on the
right at L3-4.

Conus medullaris and cauda equina: Conus extends to the T12-L1
level. Conus and cauda equina appear normal.

Paraspinal and other soft tissues: Limited imaging of the abdomen is
unremarkable. No solid organ lesions are present. There is no
significant adenopathy.

Disc levels:

T12-L1: Negative.

L1-2: Negative.

L2-3: Negative.

L3-4: A broad-based disc protrusion is asymmetric to the right.
Moderate right and mild left subarticular narrowing is present.
Severe right and moderate left foraminal stenosis is present.

L4-5: A left paramedian disc extrusion is present. This results in
moderate left subarticular narrowing. Mild foraminal narrowing is
worse on the left.

L5-S1: Leftward disc bulging is present without significant
stenosis.
IMPRESSION: 1. Right-sided disease is most significant at L3-4 with moderate
right subarticular and severe right foraminal stenosis secondary to
a broad-based asymmetric rightward disc protrusion and right-sided
facet hypertrophy.
2. Mild left subarticular and moderate left foraminal narrowing at
L3-4.
3. Moderate left subarticular narrowing at L4-5 with mild bilateral
foraminal narrowing, left greater than right.
4. Leftward disc bulging at L5-S1 without significant stenosis.
